# Patient Record
Sex: Male | Born: 1940 | Race: White | Hispanic: No | Marital: Married | Smoking: Former smoker
Health system: Southern US, Community
[De-identification: ages and names within clinical notes are randomized; demographics above are authoritative.]

## PROBLEM LIST (undated history)

## (undated) DIAGNOSIS — M069 Rheumatoid arthritis, unspecified: Secondary | ICD-10-CM

## (undated) DIAGNOSIS — L4050 Arthropathic psoriasis, unspecified: Secondary | ICD-10-CM

## (undated) DIAGNOSIS — C801 Malignant (primary) neoplasm, unspecified: Secondary | ICD-10-CM

## (undated) DIAGNOSIS — K5790 Diverticulosis of intestine, part unspecified, without perforation or abscess without bleeding: Secondary | ICD-10-CM

## (undated) DIAGNOSIS — L409 Psoriasis, unspecified: Secondary | ICD-10-CM

## (undated) HISTORY — DX: Arthropathic psoriasis, unspecified: L40.50

## (undated) HISTORY — PX: PROSTATECTOMY: SHX69

## (undated) HISTORY — DX: Rheumatoid arthritis, unspecified: M06.9

## (undated) HISTORY — PX: BACK SURGERY: SHX140

## (undated) HISTORY — PX: FINGER SURGERY: SHX640

---

## 2022-01-08 ENCOUNTER — Other Ambulatory Visit: Payer: Self-pay

## 2022-01-08 ENCOUNTER — Encounter: Payer: Self-pay | Admitting: Internal Medicine

## 2022-01-08 ENCOUNTER — Ambulatory Visit (INDEPENDENT_AMBULATORY_CARE_PROVIDER_SITE_OTHER): Payer: No Typology Code available for payment source | Admitting: Internal Medicine

## 2022-01-08 VITALS — BP 106/70 | HR 108 | Temp 97.8°F | Ht 73.0 in | Wt 136.0 lb

## 2022-01-08 DIAGNOSIS — J449 Chronic obstructive pulmonary disease, unspecified: Secondary | ICD-10-CM | POA: Diagnosis not present

## 2022-01-08 DIAGNOSIS — R0602 Shortness of breath: Secondary | ICD-10-CM | POA: Diagnosis not present

## 2022-01-08 DIAGNOSIS — J9611 Chronic respiratory failure with hypoxia: Secondary | ICD-10-CM | POA: Diagnosis not present

## 2022-01-08 DIAGNOSIS — J441 Chronic obstructive pulmonary disease with (acute) exacerbation: Secondary | ICD-10-CM

## 2022-01-08 NOTE — Addendum Note (Signed)
Addended by: Lorretta Harp on: 01/08/2022 05:25 PM ? ? Modules accepted: Orders ? ?

## 2022-01-08 NOTE — Patient Instructions (Addendum)
Please schedule follow up scheduled with myself in 3 months.  If my schedule is not open yet, we will contact you with a reminder closer to that time. Please call [phone] if you haven't heard from [place] a month before.  ? ?Before your next visit I would like you to have: ? ?Full set of PFTs- 1 hour next available ? ?Once you have the breathing testing done I can refer you to pulmonary rehab at [facility].  ? ?Take spiriva 2 puffs once a day.  ?Take wixela 1 puff twice a day.  ? ?Continue albuterol as needed.  ?

## 2022-01-08 NOTE — Progress Notes (Signed)
? ?      ?JAMUS LOVING    [id number]    03/09/1941 ? ?Primary Care Physician:Salmon, Minna Merritts, MD ? ?Referring Physician: Delorise Shiner, MD ?Randy Bowen ?[place],  [place] [id number] ?Reason for Consultation: shortness of breath ?Date of Consultation: 01/08/2022 ? ?Chief complaint:   ?Chief Complaint  ?Patient presents with  ? Consult  ?  Pt has had complaints of SOB which has been bad for the past 2 years but has been gradually getting worse. Pt has been diagnosed with COPD.  ?  ? ?HPI: ? ?Randy Bowen is a 81 y.o. who presents with worsening shortness of breath for the past 20 years. He is referred from the [place] for COPD. He is prednisone dependent for COPD on 10 mg daily but is also on it for rheumatoid arthritis and is currently on monoclonal ab for this. He hasn't been able to come off prednisone due to flares of psoriatic arthritis. Also has erythrodermic psoriasis.  ? ?He was told he had maybe 1 year to live by Dr. Gwenette Greet and that his lung function is at 36%.  ? ?Currently on spiriva and wixela and those seem to help a little bit. ?Has dyspnea with minimal exertion ? ?He is very depressed about his limited mobility and energy and is motivated to feel better. Would like to try pulmonary rehab. His prior pulmonologist has already had some long talks with him about COPD, progression and outlook ? ?Last year had RSV and shingles and was sick for a long time. Lost a lot of weight.  Had 3 rounds of antibiotics for pneumonia. ? ?Had aspiration/dysphagia evaluation and did not have any overt aspiration.  ? ?PFT's done March 2019- FEV1 34% predicted, hyperinflation present and severe impairment of DLCO ?Last ABG at [facility] was 09-13-17 on 3l [place], pH 7.47, PCO2 35, PO2 58 ?He has never had a sleep study and says he doesn't want a CPAP or BIPAP ? ?(PFTs 05/2015: FEV1.0 = 21.07 L, 32% of predicted) with hyperinflation (RV = 234% of predicted) and a severe decrease in diffusion (DLCO = 35% of predicted).  ? ?He wears  nocturnal oxygen and [facility]>  ? ?Social history: ? 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 ?Social History  ? ?Occupational History  ? Not on file  ?Tobacco Use  ? Smoking status: Former  ?  Packs/day: 1.00  ?  Years: 30.00  ?  Pack years: 30.00  ?  Types: Cigarettes  ?  Quit date: 2003  ?  Years since quitting: 20.2  ? Smokeless tobacco: Never  ?Substance and Sexual Activity  ? Alcohol use: Not on file  ? Drug use: Not on file  ? Sexual activity: Not on file  ? ? ?Relevant family history: ? ?Family History  ?Problem Relation Age of Onset  ? Emphysema Father   ? ? ?Past Medical History:  ?Diagnosis Date  ? Psoriatic arthritis ([place])   ? Rheumatoid arthritis ([place])   ? ? ?History reviewed. No pertinent surgical history. ? ? ?Physical Exam: ?Blood pressure 106/70, pulse (!) 108, temperature 97.8 ?F (36.6 ?C), temperature source Oral, height '6\' 1"'$  (1.854 m), weight 136 lb (61.7 kg), SpO2 96 %. ?Gen:      No acute distress, HOH ?ENT:  no nasal polyps, mucus membranes moist ?Lungs:    diminished, regular ?  CV:         tachycardic, regular ?Abd:      + bowel sounds; soft, non-tender; no distension ?MSK: no acute synovitis of DIP or PIP joints, no mechanics hands.  ?Skin:      Warm and dry; no rashes ?Neuro: normal speech, no focal facial asymmetry ?Psych: alert and oriented x3, normal mood and affect ? ? ?Data Reviewed/Medical Decision Making: ? ?Independent interpretation of tests: ?Imaging: ? ? ?PFTs: ?I have personally reviewed the patient's PFTs and reviewed above.  ?No flowsheet data found. ? ?Labs: CBC nov 2022 at [facility] shows Hgb 13.7 CMP same day shows glucose elevated but otherwise wnl.  ? ?Immunization status:  ?Immunization History  ?Administered Date(s) Administered  ? Pneumococcal Conjugate-13 11/04/2014, 11/27/2014  ? Pneumococcal Polysaccharide-23  08/01/2008, 08/02/2011  ? Pneumococcal-Unspecified 11/26/2002, 08/01/2009, 11/01/2009  ? ? ? I reviewed prior external note(s) from [place] ? I reviewed the result(s) of the labs and imaging as noted above.  ? I have ordered PFT ? ? 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 ?Plan/Recommendations: ? ?Continue spiriva 1 one puff once a day. Continue to take wixela 1 puff twice a day.  ?We discussed disease management and progression at length today.  ? ?Will obtain PFTs and refer to pulmonary rehab here at [facility] once I receive those.  ? ? ?Return to Care: ?Return in about 3 months (around 04/10/2022). ? ?Lenice Llamas, MD ?Pulmonary and Critical Care Medicine ?[place] ?Office:[phone] ? ?CC: Delorise Shiner, MD ? ? ? ?

## 2022-02-08 ENCOUNTER — Ambulatory Visit (INDEPENDENT_AMBULATORY_CARE_PROVIDER_SITE_OTHER): Payer: No Typology Code available for payment source | Admitting: Internal Medicine

## 2022-02-08 DIAGNOSIS — R0602 Shortness of breath: Secondary | ICD-10-CM

## 2022-02-08 DIAGNOSIS — J449 Chronic obstructive pulmonary disease, unspecified: Secondary | ICD-10-CM

## 2022-02-08 LAB — PULMONARY FUNCTION TEST
DL/VA % pred: 59 %
DL/VA: 2.31 ml/min/mmHg/L
DLCO cor % pred: 40 %
DLCO cor: 10.94 ml/min/mmHg
DLCO unc % pred: 34 %
DLCO unc: 9.29 ml/min/mmHg
FEF 25-75 Post: 0.48 L/sec
FEF 25-75 Pre: 0.42 L/sec
FEF2575-%Change-Post: 15 %
FEF2575-%Pred-Post: 20 %
FEF2575-%Pred-Pre: 17 %
FEV1-%Change-Post: 7 %
FEV1-%Pred-Post: 34 %
FEV1-%Pred-Pre: 32 %
FEV1-Post: 1.16 L
FEV1-Pre: 1.07 L
FEV1FVC-%Change-Post: 2 %
FEV1FVC-%Pred-Pre: 53 %
FEV6-%Change-Post: 5 %
FEV6-%Pred-Post: 60 %
FEV6-%Pred-Pre: 57 %
FEV6-Post: 2.64 L
FEV6-Pre: 2.51 L
FEV6FVC-%Change-Post: 0 %
FEV6FVC-%Pred-Post: 96 %
FEV6FVC-%Pred-Pre: 96 %
FVC-%Change-Post: 5 %
FVC-%Pred-Post: 62 %
FVC-%Pred-Pre: 59 %
FVC-Post: 2.91 L
FVC-Pre: 2.77 L
Post FEV1/FVC ratio: 40 %
Post FEV6/FVC ratio: 91 %
Pre FEV1/FVC ratio: 39 %
Pre FEV6/FVC Ratio: 91 %
RV % pred: 211 %
RV: 5.87 L
TLC % pred: 122 %
TLC: 9.4 L

## 2022-03-06 ENCOUNTER — Other Ambulatory Visit: Payer: Self-pay

## 2022-03-06 ENCOUNTER — Emergency Department (HOSPITAL_COMMUNITY)
Admission: EM | Admit: 2022-03-06 | Discharge: 2022-03-06 | Disposition: A | Discharge status: Home / Self Care | Payer: No Typology Code available for payment source | Attending: Student | Admitting: Student

## 2022-03-06 ENCOUNTER — Emergency Department (HOSPITAL_COMMUNITY): Payer: No Typology Code available for payment source

## 2022-03-06 ENCOUNTER — Encounter (HOSPITAL_COMMUNITY): Payer: Self-pay | Admitting: Emergency Medicine

## 2022-03-06 DIAGNOSIS — D72829 Elevated white blood cell count, unspecified: Secondary | ICD-10-CM | POA: Insufficient documentation

## 2022-03-06 DIAGNOSIS — R5383 Other fatigue: Secondary | ICD-10-CM | POA: Diagnosis not present

## 2022-03-06 DIAGNOSIS — Z87891 Personal history of nicotine dependence: Secondary | ICD-10-CM | POA: Diagnosis not present

## 2022-03-06 DIAGNOSIS — M25551 Pain in right hip: Secondary | ICD-10-CM | POA: Insufficient documentation

## 2022-03-06 DIAGNOSIS — L4050 Arthropathic psoriasis, unspecified: Secondary | ICD-10-CM

## 2022-03-06 LAB — CBC WITH DIFFERENTIAL/PLATELET
Abs Immature Granulocytes: 0.05 10*3/uL (ref 0.00–0.07)
Basophils Absolute: 0.1 10*3/uL (ref 0.0–0.1)
Basophils Relative: 1 %
Eosinophils Absolute: 0.1 10*3/uL (ref 0.0–0.5)
Eosinophils Relative: 1 %
HCT: 43.7 % (ref 39.0–52.0)
Hemoglobin: 13.7 g/dL (ref 13.0–17.0)
Immature Granulocytes: 0 %
Lymphocytes Relative: 19 %
Lymphs Abs: 2.4 10*3/uL (ref 0.7–4.0)
MCH: 29.8 pg (ref 26.0–34.0)
MCHC: 31.4 g/dL (ref 30.0–36.0)
MCV: 95 fL (ref 80.0–100.0)
Monocytes Absolute: 0.3 10*3/uL (ref 0.1–1.0)
Monocytes Relative: 3 %
Neutro Abs: 9.3 10*3/uL — ABNORMAL HIGH (ref 1.7–7.7)
Neutrophils Relative %: 76 %
Platelets: 473 10*3/uL — ABNORMAL HIGH (ref 150–400)
RBC: 4.6 MIL/uL (ref 4.22–5.81)
RDW: 14.5 % (ref 11.5–15.5)
WBC: 12.2 10*3/uL — ABNORMAL HIGH (ref 4.0–10.5)
nRBC: 0 % (ref 0.0–0.2)

## 2022-03-06 LAB — COMPREHENSIVE METABOLIC PANEL
ALT: 19 U/L (ref 0–44)
AST: 23 U/L (ref 15–41)
Albumin: 2.8 g/dL — ABNORMAL LOW (ref 3.5–5.0)
Alkaline Phosphatase: 59 U/L (ref 38–126)
Anion gap: 10 (ref 5–15)
BUN: 8 mg/dL (ref 8–23)
CO2: 26 mmol/L (ref 22–32)
Calcium: 8.8 mg/dL — ABNORMAL LOW (ref 8.9–10.3)
Chloride: 101 mmol/L (ref 98–111)
Creatinine, Ser: 0.88 mg/dL (ref 0.61–1.24)
GFR, Estimated: >60 mL/min (ref >60)
Glucose, Bld: 88 mg/dL (ref 70–99)
Potassium: 3.6 mmol/L (ref 3.5–5.1)
Sodium: 137 mmol/L (ref 135–145)
Total Bilirubin: 1.2 mg/dL (ref 0.3–1.2)
Total Protein: 6.4 g/dL — ABNORMAL LOW (ref 6.5–8.1)

## 2022-03-06 LAB — LACTIC ACID, PLASMA: Lactic Acid, Venous: 1.9 mmol/L (ref 0.5–1.9)

## 2022-03-06 LAB — SEDIMENTATION RATE: Sed Rate: 37 mm/hr — ABNORMAL HIGH (ref 0–16)

## 2022-03-06 MED ORDER — ONDANSETRON HCL 4 MG/2ML IJ SOLN
4.0000 mg | Freq: Once | INTRAMUSCULAR | Status: AC
  Administered 2022-03-06: 4 mg via INTRAVENOUS
  Filled 2022-03-06: qty 2

## 2022-03-06 MED ORDER — HYDROMORPHONE HCL 1 MG/ML IJ SOLN
1.0000 mg | Freq: Once | INTRAMUSCULAR | Status: AC
  Administered 2022-03-06: 1 mg via INTRAVENOUS
  Filled 2022-03-06: qty 1

## 2022-03-06 MED ORDER — HYDROCODONE-ACETAMINOPHEN 5-325 MG PO TABS: 2.0000 | ORAL_TABLET | ORAL | 0 refills | Status: DC | PRN

## 2022-03-06 MED ORDER — MORPHINE SULFATE (PF) 4 MG/ML IV SOLN
4.0000 mg | Freq: Once | INTRAVENOUS | Status: AC
  Administered 2022-03-06: 4 mg via INTRAVENOUS
  Filled 2022-03-06: qty 1

## 2022-03-06 MED ORDER — PREDNISONE 10 MG PO TABS: 10.0000 mg | ORAL_TABLET | Freq: Every day | ORAL | 0 refills | Status: AC

## 2022-03-06 MED ORDER — NAPROXEN 375 MG PO TABS: 375.0000 mg | ORAL_TABLET | Freq: Two times a day (BID) | ORAL | 0 refills | Status: DC

## 2022-03-06 NOTE — H&P (Deleted)
Family Medicine Teaching Service ?Service Pager: [phone] ?Brief Note ? ?Patient name: Randy Bowen Medical record number: [id number] ?Date of birth: Mar 28, 1941 Age: 81 y.o. Gender: male ? ?Primary Care Provider: Lucas Mallow, MD ?Preferred Emergency Contact:  Primary Emergency Contact: Windell Norfolk ? ?Chief Complaint: Generalized weakness and pain ? ?Assessment and Plan: ?Randy Bowen is a 81 y.o. male with PMH rheumatoid arthritis with multiple involvement, psoriatic arthritis, COPD, metastatic prostate cancer , who presented to [place] (03/06/2022) for generalized weakness and full-body pain after running out of his home prednisone 10 mg daily x2 days.   ? ?[facility]. ? ?In the ED, normotensive, comfortable on 3 L oxygen, no fever, alert and oriented x4, no focal deficits.  Mild leukocytosis of 12.2, elevated ESR 37.  They received IV morphine and Dilaudid to good effect. ? ?After discussing with ED provider, patient, family, attending, patient is not meet requirement for hospitalization. ?Rather we will refill patient's home prednisone to bridge him until he sees his PCP at the [facility] for hospital follow-up. ?Given patient's deconditioning, TOC consult for home health PT/OT/RN for assistance at home. ?TOC Consult ?Home prednisone 10 mg daily ?Encouraged follow-up with PCP at the [facility] for refills and referrals to rheumatology ? ? ?History of Present Illness:   ?Randy Bowen is a 81 y.o. male with PMH rheumatoid arthritis with multiple involvement, psoriatic arthritis, COPD, metastatic prostate cancer , who presented to [facility] (03/06/2022) for generalized weakness and full-body pain after running out of his home prednisone 10 mg daily x2 days.   ? ?Wife at bedside. ? ?Last night couldn't not get up and walk. States he has been in a lot of pain which wife says has been gong on since he was tried a new biologic for RA and psoriatic arthritis. Wife states he has 7 different types of arthritis. Took biologic 2  months ago and immediately had a reaction to it. Has had less than 20% sensation in lower extremities bilaterally but feels sensation has worsened. Denies any issues urinating. States whole body is in pain, can not pick an area that hurts worse.  ? ?States he typically takes prednisone 72m but has been out of it for over a month. ? ?States he had shingles in June last year and after that has had 3 episodes of pneumonia. ?Diagnosed with erythrodermic psoriasis  ? ?Recently moved to the area and does not currently see a rheumatologist. [place] rheumatologist [place] in [place]  ? ?Past Medical History: ?Past Medical History:  ?Diagnosis Date  ? Psoriatic arthritis ([place]   ? Rheumatoid arthritis ([place]   ? ? ?Past Surgical History: ?History reviewed. No pertinent surgical history. ? ?Social History: ?Social History  ? ?Tobacco Use  ? Smoking status: Former  ?  Packs/day: 1.00  ?  Years: 30.00  ?  Pack years: 30.00  ?  Types: Cigarettes  ?  Quit date: 2003  ?  Years since quitting: 20.3  ? Smokeless tobacco: Never  ? ?Please also refer to relevant sections of EMR. ? ?Family History: ?Family History  ?Problem Relation Age of Onset  ? Emphysema Father   ? ? ?Allergies and Medications: ?Allergies  ?Allergen Reactions  ? Skyrizi [Risankizumab] Nausea And Vomiting  ? ?No current facility-administered medications on file prior to encounter.  ? ?Current Outpatient Medications on File Prior to Encounter  ?Medication Sig Dispense Refill  ? Calcium Carb-Cholecalciferol 500-10 MG-MCG TABS Take 2 tablets by mouth daily.    ? Cholecalciferol 25 MCG (1000 UT)  tablet Take 2 tablets by mouth daily.    ? fluticasone-salmeterol (WIXELA INHUB) 500-50 MCG/ACT AEPB Inhale 1 puff into the lungs in the morning and at bedtime.    ? Ixekizumab 80 MG/ML SOAJ INJECT 160MG (2 AUTOINJECTORS) SUBCUTANEOUSLY ONCE FOR PSORIASIS    ? lidocaine (LIDODERM) 5 % APPLY 1 PATCH TO SKIN ONCE DAILY (APPLY FOR 12 HOURS, THEN REMOVE FOR 12 HOURS)    ? prednisoLONE  Sodium Phosphate POWD See admin instructions.    ? predniSONE (DELTASONE) 10 MG tablet TAKE ONE TABLET BY MOUTH DAILY TAKE WITH FOOD OR MILK    ? Tiotropium Bromide Monohydrate (SPIRIVA RESPIMAT) 2.5 MCG/ACT AERS Inhale into the lungs.    ? ? ?Objective: ?BP 105/71 (BP Location: Right Arm)   Pulse 100   Temp 98.9 ?F (37.2 ?C) (Oral)   Resp 18   Ht _0  (1.854 m)   SpO2 94%   BMI 17.94 kg/m?  ? ?Labs and Imaging: ?CBC BMET  ?Recent Labs  ?Lab 03/06/22 ?1207  ?WBC 12.2*  ?HGB 13.7  ?HCT 43.7  ?PLT 473*  ? Recent Labs  ?Lab 03/06/22 ?1207  ?NA 137  ?K 3.6  ?CL 101  ?CO2 26  ?BUN 8  ?CREATININE 0.88  ?GLUCOSE 88  ?CALCIUM 8.8*  ?  ? ?EKG Interpretation ? ?Date/Time:    ?Ventricular Rate:    ?PR Interval:    ?QRS Duration:   ?QT Interval:    ?QTC Calculation:   ?R Axis:     ?Text Interpretation:   ?  ? ?DG Chest 2 View ? ?Result Date: 03/06/2022 ?CLINICAL DATA:  Fever and cough for several days. Dyspnea. Fatigue. EXAM: CHEST - 2 VIEW COMPARISON:  None Available. FINDINGS: The heart size and mediastinal contours are within normal limits. Mild pulmonary hyperinflation and interstitial prominence is suspicious for COPD. No evidence of focal infiltrate or pleural effusion. Several mild upper and midthoracic vertebral body compression deformities are. IMPRESSION: Probable COPD. No active cardiopulmonary disease. Electronically Signed   By: Marlaine Hind M.D.   On: 03/06/2022 11:14  ? ?DG Hip Unilat W or Wo Pelvis 2-3 Views Right ? ?Result Date: 03/06/2022 ?CLINICAL DATA:  Right hip pain after fall mild EXAM: DG HIP (WITH OR WITHOUT PELVIS) 2-3V RIGHT COMPARISON:  None Available. FINDINGS: Osseous demineralization. There is no evidence of hip fracture or dislocation. Mild joint space narrowing of both hips. Degenerative disc disease of the lower lumbar spine. Numerous surgical clips within the pelvis. IMPRESSION: Negative. Electronically Signed   By: Davina Poke D.O.   On: 03/06/2022 12:55    ? ?Merrily Brittle,  DO ?03/06/2022, 3:04 PM ?PGY-1, [facility] ?[place] [facility] pager: [phone], text pages welcome  ?

## 2022-03-06 NOTE — ED Notes (Signed)
Got patient into a gown on the monitor patient is resting with family at bedside 

## 2022-03-06 NOTE — ED Triage Notes (Signed)
Pt arrives from home with complaints of generalized weakness, cough and fever for the past two days. Pt with hx of recurrent PNA. Patient recently admitted to [facility] in [place] in April. Per EMS patient has diminished lung sounds all over. Pt at baseline on 2 L O2 per [place] due to COPD but patient now on 3 L increased by EMS.  Per EMS pt not hypoxic but for comfort. VS: ? ?BP-96/68 ?HR 106  ?98% SpO2 on 3 L O2 [place].  ?

## 2022-03-06 NOTE — TOC Transition Note (Addendum)
Transition of Care (TOC) - CM/SW Discharge Note ? ? ?Patient Details  ?Name: Randy Bowen ?MRN: [id number] ?Date of Birth: 1941-06-17 ? ?Transition of Care (TOC) CM/SW Contact:  ?Verdell Carmine, RN ?Phone Number: ?03/06/2022, 4:56 PM ? ? ?Clinical Narrative:    ? ?Spoke with patient and wife at bedside regarding home health and needs. Patient states he would like PT OT aide, and social work. Struggle pulling things together with no point person, so added social work.  ? ?He goes to [place] for primary care.  ?Messaged [facility] regarding acceptance of [place]. Fax for authorization to [facility] attn Steva Ready [phone] orders, summary H&P and consults.  ?Patient has decided to leave by private vehicle for discharge. He can do without "oxygen while sitting up"   He is currently on 2L and saturating 93% while laying down. He states this is his normal flow. .He states his leg does feel better., and can move his right leg and bend it without too much pain.  ?Alvis Lemmings accepted for [facility] pending [facility] authorization. Patient is aware they have to process orders first.  ? ? ?Final next level of care: [place] ?Barriers to Discharge: No Barriers Identified ? ? ?Patient Goals and CMS Choice ?Patient states their goals for this hospitalization and ongoing recovery are:: Go back home pain control ?  ?  ? ?Discharge Placement ?  ?           ? Home with home health  ?  ?  ?  ? ?Discharge Plan and Services ?  ?  ?           ?  ?DME Agency:  (Has walker, scooter electronic wheelchair) ?  ?  ?  ?HH Arranged: PT, OT, Nurse's Aide, Social Work ?[place] Agency: [place] ?Date HH Agency Contacted: 03/06/22 ?Time [place]: [phone] ?Representative spoke with at [place]: Tommi Rumps ? ?Social Determinants of Health (SDOH) Interventions ?  ? ? ?Readmission Risk Interventions ?   ? View : No data to display.  ?  ?  ?  ? ? ? ? ? ?

## 2022-03-06 NOTE — ED Provider Notes (Signed)
?[facility] ?Provider Note ? ?CSN: [id number] ?Arrival date & time: 03/06/22 1006 ? ?Chief Complaint(s) ?Fatigue, Cough (fe), and Fever ? ?HPI ?Randy Bowen is a 81 y.o. male with PMH rheumatoid arthritis, psoriatic arthritis who presents the emergency department for evaluation of fatigue and joint pain.  Patient arrives with his wife who states that the patient has had frequent falls due to his lower extremity pain in all joints and the entire body getting worse.  Wife concerned that she may not be able to care for him at home.  Patient has multiple underlying autoimmune disorders and was recently admitted at [facility] and had improvement with steroids.  He currently denies chest pain, shortness of breath, cough, headache, fever or other systemic symptoms. ? ? ?Past Medical History ?Past Medical History:  ?Diagnosis Date  ? Psoriatic arthritis ([place])   ? Rheumatoid arthritis ([place])   ? ?There are no problems to display for this patient. ? ?Home Medication(s) ?Prior to Admission medications   ?Medication Sig Start Date End Date Taking? Authorizing Provider  ?predniSONE (DELTASONE) 10 MG tablet Take 1 tablet (10 mg total) by mouth daily for 15 days. 03/06/22 03/21/22 Yes Karsin Pesta, MD  ?Calcium Carb-Cholecalciferol 500-10 MG-MCG TABS Take 2 tablets by mouth daily. 09/19/12   [provider]  ?Cholecalciferol 25 MCG (1000 UT) tablet Take 2 tablets by mouth daily. 04/14/20   [provider]  ?fluticasone-salmeterol (WIXELA INHUB) 500-50 MCG/ACT AEPB Inhale 1 puff into the lungs in the morning and at bedtime.    [provider]  ?Ixekizumab 80 MG/ML SOAJ INJECT '160MG'$  (2 AUTOINJECTORS) SUBCUTANEOUSLY ONCE FOR PSORIASIS 12/24/21   [provider]  ?lidocaine (LIDODERM) 5 % APPLY 1 PATCH TO SKIN ONCE DAILY (APPLY FOR 12 HOURS, THEN REMOVE FOR 12 HOURS) 12/03/21   [provider]  ?prednisoLONE Sodium Phosphate POWD See admin  instructions. 12/23/17   [provider]  ?predniSONE (DELTASONE) 10 MG tablet TAKE ONE TABLET BY MOUTH DAILY TAKE WITH FOOD OR MILK 07/25/19   [provider]  ?Tiotropium Bromide Monohydrate (SPIRIVA RESPIMAT) 2.5 MCG/ACT AERS Inhale into the lungs.    [provider]  ?                                                                                                                                  ?Past Surgical History ?History reviewed. No pertinent surgical history. ?Family History ?Family History  ?Problem Relation Age of Onset  ? Emphysema Father   ? ? ?Social History ?Social History  ? ?Tobacco Use  ? Smoking status: Former  ?  Packs/day: 1.00  ?  Years: 30.00  ?  Pack years: 30.00  ?  Types: Cigarettes  ?  Quit date: 2003  ?  Years since quitting: 20.3  ? Smokeless tobacco: Never  ? ?Allergies ?Skyrizi [risankizumab] ? ?Review of Systems ?Review of Systems  ?Musculoskeletal:  Positive  for arthralgias and myalgias.  ? ?Physical Exam ?Vital Signs  ?I have reviewed the triage vital signs ?BP 96/77   Pulse 94   Temp 98.9 ?F (37.2 ?C) (Oral)   Resp 16   Ht '6\' 1"'$  (1.854 m)   SpO2 92%   BMI 17.94 kg/m?  ? ?Physical Exam ?Constitutional:   ?   General: He is not in acute distress. ?   Appearance: Normal appearance.  ?HENT:  ?   Head: Normocephalic and atraumatic.  ?   Nose: No congestion or rhinorrhea.  ?Eyes:  ?   General:     ?   Right eye: No discharge.     ?   Left eye: No discharge.  ?   Extraocular Movements: Extraocular movements intact.  ?   Pupils: Pupils are equal, round, and reactive to light.  ?Cardiovascular:  ?   Rate and Rhythm: Normal rate and regular rhythm.  ?   Heart sounds: No murmur heard. ?Pulmonary:  ?   Effort: No respiratory distress.  ?   Breath sounds: No wheezing or rales.  ?Abdominal:  ?   General: There is no distension.  ?   Tenderness: There is no abdominal tenderness.  ?Musculoskeletal:     ?   General: Tenderness (All joints, greatest at right hip)  present. Normal range of motion.  ?   Cervical back: Normal range of motion.  ?Skin: ?   General: Skin is warm and dry.  ?Neurological:  ?   General: No focal deficit present.  ?   Mental Status: He is alert.  ? ? ?ED Results and Treatments ?Labs ?(all labs ordered are listed, but only abnormal results are displayed) ?Labs Reviewed  ?COMPREHENSIVE METABOLIC PANEL - Abnormal; Notable for the following components:  ?    Result Value  ? Calcium 8.8 (*)   ? Total Protein 6.4 (*)   ? Albumin 2.8 (*)   ? All other components within normal limits  ?CBC WITH DIFFERENTIAL/PLATELET - Abnormal; Notable for the following components:  ? WBC 12.2 (*)   ? Platelets 473 (*)   ? Neutro Abs 9.3 (*)   ? All other components within normal limits  ?SEDIMENTATION RATE - Abnormal; Notable for the following components:  ? Sed Rate 37 (*)   ? All other components within normal limits  ?CULTURE, BLOOD (ROUTINE X 2)  ?CULTURE, BLOOD (ROUTINE X 2)  ?LACTIC ACID, PLASMA  ?BLOOD GAS, VENOUS  ?LACTIC ACID, PLASMA  ?C-REACTIVE PROTEIN  ?                                                                                                                       ? ?Radiology ?DG Chest 2 View ? ?Result Date: 03/06/2022 ?CLINICAL DATA:  Fever and cough for several days. Dyspnea. Fatigue. EXAM: CHEST - 2 VIEW COMPARISON:  None Available. FINDINGS: The heart size and mediastinal contours are within normal limits. Mild pulmonary hyperinflation and interstitial prominence is suspicious for COPD. No  evidence of focal infiltrate or pleural effusion. Several mild upper and midthoracic vertebral body compression deformities are. IMPRESSION: Probable COPD. No active cardiopulmonary disease. Electronically Signed   By: Marlaine Hind M.D.   On: 03/06/2022 11:14  ? ?DG Hip Unilat W or Wo Pelvis 2-3 Views Right ? ?Result Date: 03/06/2022 ?CLINICAL DATA:  Right hip pain after fall mild EXAM: DG HIP (WITH OR WITHOUT PELVIS) 2-3V RIGHT COMPARISON:  None Available. FINDINGS:  Osseous demineralization. There is no evidence of hip fracture or dislocation. Mild joint space narrowing of both hips. Degenerative disc disease of the lower lumbar spine. Numerous surgical clips within the pelvis. IMPRESSION: Negative. Electronically Signed   By: Davina Poke D.O.   On: 03/06/2022 12:55   ? ?Pertinent labs & imaging results that were available during my care of the patient were reviewed by me and considered in my medical decision making (see MDM for details). ? ?Medications Ordered in ED ?Medications  ?morphine (PF) 4 MG/ML injection 4 mg (4 mg Intravenous Given 03/06/22 1201)  ?ondansetron [facility]) injection 4 mg (4 mg Intravenous Given 03/06/22 1202)  ?HYDROmorphone (DILAUDID) injection 1 mg (1 mg Intravenous Given 03/06/22 1349)  ?                                                               ?                                                                    ?Procedures ?Procedures ? ?(including critical care time) ? ?Medical Decision Making / ED Course ? ? ?This patient presents to the ED for concern of joint pain, this involves an extensive number of treatment options, and is a complaint that carries with it a high risk of complications and morbidity.  The differential diagnosis includes psoriatic arthritis flare, rheumatoid arthritis flare, septic joint, sepsis, electrolyte abnormality ? ?MDM: ?Patient seen emergency department for evaluation of joint pain.  Physical exam reveals generalized tenderness to all joints but greatest at the right hip.  No associated joint swelling or erythema.  Laboratory evaluation with a mild leukocytosis to 12.2, sed rate elevated at 37 but lab evaluation otherwise unremarkable.  Chest x-ray and right hip right x-ray unremarkable.  Patient given pain control in the emergency department and symptoms improved.  I initially had plans to admit the patient for pain control and steroid administration for his rheumatologic flare, but the patient was evaluated by the  family medicine service and after a long discussion, it was agreed that the patient will be discharged with home health and steroids.  This is not an unreasonable disposition for this patient and the family agrees w

## 2022-03-06 NOTE — Progress Notes (Signed)
Family Medicine Teaching Service ?Service Pager: [phone] ?Brief Note ? ?Patient name: Randy Bowen Medical record number: [id number] ?Date of birth: Mar 28, 1941 Age: 81 y.o. Gender: male ? ?Primary Care Provider: Lucas Mallow, MD ?Preferred Emergency Contact:  Primary Emergency Contact: Windell Norfolk ? ?Chief Complaint: Generalized weakness and pain ? ?Assessment and Plan: ?Randy Bowen is a 81 y.o. male with PMH rheumatoid arthritis with multiple involvement, psoriatic arthritis, COPD, metastatic prostate cancer , who presented to [facility] (03/06/2022) for generalized weakness and full-body pain after running out of his home prednisone 10 mg daily x2 days.   ? ?[facility] connected. ? ?In the ED, normotensive, comfortable on 3 L oxygen, no fever, alert and oriented x4, no focal deficits.  Mild leukocytosis of 12.2, elevated ESR 37.  They received IV morphine and Dilaudid to good effect. ? ?After discussing with ED provider, patient, family, attending, patient is not meet requirement for hospitalization. ?Rather we will refill patient's home prednisone to bridge him until he sees his PCP at the [facility] for hospital follow-up. ?Given patient's deconditioning, TOC consult for home health PT/OT/RN for assistance at home. ?TOC Consult ?Home prednisone 10 mg daily ?Encouraged follow-up with PCP at the [facility] for refills and referrals to rheumatology ? ? ?History of Present Illness:   ?Randy Bowen is a 81 y.o. male with PMH rheumatoid arthritis with multiple involvement, psoriatic arthritis, COPD, metastatic prostate cancer , who presented to [facility] (03/06/2022) for generalized weakness and full-body pain after running out of his home prednisone 10 mg daily x2 days.   ? ?Wife at bedside. ? ?Last night couldn't not get up and walk. States he has been in a lot of pain which wife says has been gong on since he was tried a new biologic for RA and psoriatic arthritis. Wife states he has 7 different types of arthritis. Took biologic 2  months ago and immediately had a reaction to it. Has had less than 20% sensation in lower extremities bilaterally but feels sensation has worsened. Denies any issues urinating. States whole body is in pain, can not pick an area that hurts worse.  ? ?States he typically takes prednisone 72m but has been out of it for over a month. ? ?States he had shingles in June last year and after that has had 3 episodes of pneumonia. ?Diagnosed with erythrodermic psoriasis  ? ?Recently moved to the area and does not currently see a rheumatologist. [place]  ? ?Past Medical History: ?Past Medical History:  ?Diagnosis Date  ? [place]   ? [place]   ? ? ?Past Surgical History: ?History reviewed. No pertinent surgical history. ? ?Social History: ?Social History  ? ?Tobacco Use  ? Smoking status: Former  ?  Packs/day: 1.00  ?  Years: 30.00  ?  Pack years: 30.00  ?  Types: Cigarettes  ?  Quit date: 2003  ?  Years since quitting: 20.3  ? Smokeless tobacco: Never  ? ?Please also refer to relevant sections of EMR. ? ?Family History: ?Family History  ?Problem Relation Age of Onset  ? Emphysema Father   ? ? ?Allergies and Medications: ?Allergies  ?Allergen Reactions  ? Skyrizi [Risankizumab] Nausea And Vomiting  ? ?No current facility-administered medications on file prior to encounter.  ? ?Current Outpatient Medications on File Prior to Encounter  ?Medication Sig Dispense Refill  ? Calcium Carb-Cholecalciferol 500-10 MG-MCG TABS Take 2 tablets by mouth daily.    ? Cholecalciferol 25 MCG (1000 UT)  tablet Take 2 tablets by mouth daily.    ? fluticasone-salmeterol (WIXELA INHUB) 500-50 MCG/ACT AEPB Inhale 1 puff into the lungs in the morning and at bedtime.    ? Ixekizumab 80 MG/ML SOAJ INJECT 160MG (2 AUTOINJECTORS) SUBCUTANEOUSLY ONCE FOR PSORIASIS    ? lidocaine (LIDODERM) 5 % APPLY 1 PATCH TO SKIN ONCE DAILY (APPLY FOR 12 HOURS, THEN REMOVE FOR 12 HOURS)    ? prednisoLONE  Sodium Phosphate POWD See admin instructions.    ? predniSONE (DELTASONE) 10 MG tablet TAKE ONE TABLET BY MOUTH DAILY TAKE WITH FOOD OR MILK    ? Tiotropium Bromide Monohydrate (SPIRIVA RESPIMAT) 2.5 MCG/ACT AERS Inhale into the lungs.    ? ? ?Objective: ?BP 96/77   Pulse 94   Temp 98.9 ?F (37.2 ?C) (Oral)   Resp 16   Ht _0  (1.854 m)   SpO2 92%   BMI 17.94 kg/m?  ? ?Labs and Imaging: ?CBC BMET  ?Recent Labs  ?Lab 03/06/22 ?1207  ?WBC 12.2*  ?HGB 13.7  ?HCT 43.7  ?PLT 473*  ? ? Recent Labs  ?Lab 03/06/22 ?1207  ?NA 137  ?K 3.6  ?CL 101  ?CO2 26  ?BUN 8  ?CREATININE 0.88  ?GLUCOSE 88  ?CALCIUM 8.8*  ? ?  ? ? ?DG Chest 2 View ? ?Result Date: 03/06/2022 ?CLINICAL DATA:  Fever and cough for several days. Dyspnea. Fatigue. EXAM: CHEST - 2 VIEW COMPARISON:  None Available. FINDINGS: The heart size and mediastinal contours are within normal limits. Mild pulmonary hyperinflation and interstitial prominence is suspicious for COPD. No evidence of focal infiltrate or pleural effusion. Several mild upper and midthoracic vertebral body compression deformities are. IMPRESSION: Probable COPD. No active cardiopulmonary disease. Electronically Signed   By: Marlaine Hind M.D.   On: 03/06/2022 11:14  ? ?DG Hip Unilat W or Wo Pelvis 2-3 Views Right ? ?Result Date: 03/06/2022 ?CLINICAL DATA:  Right hip pain after fall mild EXAM: DG HIP (WITH OR WITHOUT PELVIS) 2-3V RIGHT COMPARISON:  None Available. FINDINGS: Osseous demineralization. There is no evidence of hip fracture or dislocation. Mild joint space narrowing of both hips. Degenerative disc disease of the lower lumbar spine. Numerous surgical clips within the pelvis. IMPRESSION: Negative. Electronically Signed   By: Davina Poke D.O.   On: 03/06/2022 12:55    ? ?Merrily Brittle, DO ?03/06/2022, 4:30 PM ?PGY-1, [place] ?[place] Intern pager: [phone], text pages welcome  ?

## 2022-03-11 LAB — CULTURE, BLOOD (ROUTINE X 2)
Culture: NO GROWTH
Special Requests: ADEQUATE

## 2022-03-21 ENCOUNTER — Emergency Department (HOSPITAL_COMMUNITY): Payer: No Typology Code available for payment source

## 2022-03-21 ENCOUNTER — Encounter (HOSPITAL_COMMUNITY): Payer: Self-pay | Admitting: *Deleted

## 2022-03-21 ENCOUNTER — Observation Stay (HOSPITAL_COMMUNITY)
Admission: EM | Admit: 2022-03-21 | Discharge: 2022-03-22 | Disposition: A | Discharge status: Home Health | Payer: No Typology Code available for payment source | Attending: Internal Medicine | Admitting: Internal Medicine

## 2022-03-21 DIAGNOSIS — J9611 Chronic respiratory failure with hypoxia: Secondary | ICD-10-CM | POA: Insufficient documentation

## 2022-03-21 DIAGNOSIS — Z7952 Long term (current) use of systemic steroids: Secondary | ICD-10-CM | POA: Diagnosis not present

## 2022-03-21 DIAGNOSIS — Z8546 Personal history of malignant neoplasm of prostate: Secondary | ICD-10-CM | POA: Diagnosis not present

## 2022-03-21 DIAGNOSIS — E441 Mild protein-calorie malnutrition: Secondary | ICD-10-CM | POA: Insufficient documentation

## 2022-03-21 DIAGNOSIS — R000 Tachycardia, unspecified: Secondary | ICD-10-CM | POA: Insufficient documentation

## 2022-03-21 DIAGNOSIS — L408 Other psoriasis: Secondary | ICD-10-CM | POA: Diagnosis not present

## 2022-03-21 DIAGNOSIS — R531 Weakness: Secondary | ICD-10-CM | POA: Diagnosis not present

## 2022-03-21 DIAGNOSIS — J441 Chronic obstructive pulmonary disease with (acute) exacerbation: Principal | ICD-10-CM | POA: Insufficient documentation

## 2022-03-21 DIAGNOSIS — Z20822 Contact with and (suspected) exposure to covid-19: Secondary | ICD-10-CM | POA: Insufficient documentation

## 2022-03-21 DIAGNOSIS — R0602 Shortness of breath: Secondary | ICD-10-CM

## 2022-03-21 DIAGNOSIS — Z85828 Personal history of other malignant neoplasm of skin: Secondary | ICD-10-CM | POA: Insufficient documentation

## 2022-03-21 DIAGNOSIS — Z87891 Personal history of nicotine dependence: Secondary | ICD-10-CM | POA: Diagnosis not present

## 2022-03-21 DIAGNOSIS — D75839 Thrombocytosis, unspecified: Secondary | ICD-10-CM | POA: Diagnosis not present

## 2022-03-21 DIAGNOSIS — I714 Abdominal aortic aneurysm, without rupture, unspecified: Secondary | ICD-10-CM | POA: Diagnosis not present

## 2022-03-21 DIAGNOSIS — J449 Chronic obstructive pulmonary disease, unspecified: Secondary | ICD-10-CM

## 2022-03-21 LAB — CBC WITH DIFFERENTIAL/PLATELET
Abs Immature Granulocytes: 0.09 10*3/uL — ABNORMAL HIGH (ref 0.00–0.07)
Basophils Absolute: 0 10*3/uL (ref 0.0–0.1)
Basophils Relative: 0 %
Eosinophils Absolute: 0.2 10*3/uL (ref 0.0–0.5)
Eosinophils Relative: 1 %
HCT: 40.2 % (ref 39.0–52.0)
Hemoglobin: 12.9 g/dL — ABNORMAL LOW (ref 13.0–17.0)
Immature Granulocytes: 1 %
Lymphocytes Relative: 19 %
Lymphs Abs: 2.6 10*3/uL (ref 0.7–4.0)
MCH: 29.5 pg (ref 26.0–34.0)
MCHC: 32.1 g/dL (ref 30.0–36.0)
MCV: 92 fL (ref 80.0–100.0)
Monocytes Absolute: 0.7 10*3/uL (ref 0.1–1.0)
Monocytes Relative: 5 %
Neutro Abs: 10.3 10*3/uL — ABNORMAL HIGH (ref 1.7–7.7)
Neutrophils Relative %: 74 %
Platelets: 488 10*3/uL — ABNORMAL HIGH (ref 150–400)
RBC: 4.37 MIL/uL (ref 4.22–5.81)
RDW: 14.4 % (ref 11.5–15.5)
WBC: 13.9 10*3/uL — ABNORMAL HIGH (ref 4.0–10.5)
nRBC: 0 % (ref 0.0–0.2)

## 2022-03-21 LAB — URINALYSIS, ROUTINE W REFLEX MICROSCOPIC
Bilirubin Urine: NEGATIVE
Glucose, UA: NEGATIVE mg/dL
Hgb urine dipstick: NEGATIVE
Ketones, ur: NEGATIVE mg/dL
Leukocytes,Ua: NEGATIVE
Nitrite: NEGATIVE
Protein, ur: NEGATIVE mg/dL
Specific Gravity, Urine: 1.014 (ref 1.005–1.030)
pH: 5 (ref 5.0–8.0)

## 2022-03-21 LAB — TSH: TSH: 4.366 u[IU]/mL (ref 0.350–4.500)

## 2022-03-21 LAB — COMPREHENSIVE METABOLIC PANEL
ALT: 18 U/L (ref 0–44)
AST: 21 U/L (ref 15–41)
Albumin: 2.7 g/dL — ABNORMAL LOW (ref 3.5–5.0)
Alkaline Phosphatase: 66 U/L (ref 38–126)
Anion gap: 8 (ref 5–15)
BUN: 8 mg/dL (ref 8–23)
CO2: 25 mmol/L (ref 22–32)
Calcium: 8.7 mg/dL — ABNORMAL LOW (ref 8.9–10.3)
Chloride: 104 mmol/L (ref 98–111)
Creatinine, Ser: 0.81 mg/dL (ref 0.61–1.24)
GFR, Estimated: >60 mL/min (ref >60)
Glucose, Bld: 88 mg/dL (ref 70–99)
Potassium: 4 mmol/L (ref 3.5–5.1)
Sodium: 137 mmol/L (ref 135–145)
Total Bilirubin: 0.6 mg/dL (ref 0.3–1.2)
Total Protein: 6 g/dL — ABNORMAL LOW (ref 6.5–8.1)

## 2022-03-21 LAB — BRAIN NATRIURETIC PEPTIDE: B Natriuretic Peptide: 25.4 pg/mL (ref 0.0–100.0)

## 2022-03-21 LAB — TROPONIN I (HIGH SENSITIVITY)
Troponin I (High Sensitivity): 47 ng/L — ABNORMAL HIGH (ref <18)
Troponin I (High Sensitivity): 21 ng/L — ABNORMAL HIGH (ref <18)

## 2022-03-21 LAB — RESP PANEL BY RT-PCR (FLU A&B, COVID) ARPGX2
Influenza A by PCR: NEGATIVE
Influenza B by PCR: NEGATIVE
SARS Coronavirus 2 by RT PCR: NEGATIVE

## 2022-03-21 LAB — D-DIMER, QUANTITATIVE: D-Dimer, Quant: 12.74 ug/mL-FEU — ABNORMAL HIGH (ref 0.00–0.50)

## 2022-03-21 MED ORDER — HYDROCODONE-ACETAMINOPHEN 10-325 MG PO TABS
1.0000 | ORAL_TABLET | Freq: Four times a day (QID) | ORAL | Status: DC | PRN
  Administered 2022-03-21: 1 via ORAL
  Filled 2022-03-21: qty 1

## 2022-03-21 MED ORDER — UMECLIDINIUM BROMIDE 62.5 MCG/ACT IN AEPB
1.0000 | INHALATION_SPRAY | Freq: Every day | RESPIRATORY_TRACT | Status: DC
  Filled 2022-03-21: qty 7

## 2022-03-21 MED ORDER — ENSURE ENLIVE PO LIQD
237.0000 mL | Freq: Two times a day (BID) | ORAL | Status: DC
  Administered 2022-03-21 – 2022-03-22 (×2): 237 mL via ORAL
  Filled 2022-03-21: qty 237

## 2022-03-21 MED ORDER — METHYLPREDNISOLONE SODIUM SUCC 125 MG IJ SOLR
125.0000 mg | Freq: Once | INTRAMUSCULAR | Status: AC
  Administered 2022-03-21: 125 mg via INTRAVENOUS
  Filled 2022-03-21: qty 2

## 2022-03-21 MED ORDER — AZITHROMYCIN 250 MG PO TABS: 500.0000 mg | ORAL_TABLET | Freq: Once | ORAL | Status: DC

## 2022-03-21 MED ORDER — LACTATED RINGERS IV SOLN: INTRAVENOUS | Status: DC

## 2022-03-21 MED ORDER — IPRATROPIUM-ALBUTEROL 0.5-2.5 (3) MG/3ML IN SOLN: 3.0000 mL | RESPIRATORY_TRACT | Status: DC | PRN

## 2022-03-21 MED ORDER — MOMETASONE FURO-FORMOTEROL FUM 200-5 MCG/ACT IN AERO
2.0000 | INHALATION_SPRAY | Freq: Two times a day (BID) | RESPIRATORY_TRACT | Status: DC
  Filled 2022-03-21: qty 8.8

## 2022-03-21 MED ORDER — SODIUM CHLORIDE 0.9 % IV SOLN: 500.0000 mg | Freq: Once | INTRAVENOUS | Status: DC

## 2022-03-21 MED ORDER — HYDROCODONE-ACETAMINOPHEN 5-325 MG PO TABS: 1.0000 | ORAL_TABLET | Freq: Four times a day (QID) | ORAL | Status: DC | PRN

## 2022-03-21 MED ORDER — HYDROCODONE-ACETAMINOPHEN 5-325 MG PO TABS
2.0000 | ORAL_TABLET | Freq: Four times a day (QID) | ORAL | Status: DC | PRN
  Filled 2022-03-21: qty 2

## 2022-03-21 MED ORDER — ONDANSETRON HCL 4 MG/2ML IJ SOLN: 4.0000 mg | Freq: Four times a day (QID) | INTRAMUSCULAR | Status: DC | PRN

## 2022-03-21 MED ORDER — SODIUM CHLORIDE 0.9 % IV SOLN
Freq: Once | INTRAVENOUS | Status: AC
Start: 2022-03-21 — End: 2022-03-21

## 2022-03-21 MED ORDER — AZITHROMYCIN 250 MG PO TABS
500.0000 mg | ORAL_TABLET | Freq: Every day | ORAL | Status: DC
  Administered 2022-03-22: 500 mg via ORAL
  Filled 2022-03-21: qty 2

## 2022-03-21 MED ORDER — PREDNISONE 20 MG PO TABS
40.0000 mg | ORAL_TABLET | Freq: Every day | ORAL | Status: DC
Start: 2022-03-22 — End: 2022-03-22
  Administered 2022-03-22: 40 mg via ORAL
  Filled 2022-03-21: qty 2

## 2022-03-21 MED ORDER — ENOXAPARIN SODIUM 40 MG/0.4ML IJ SOSY
40.0000 mg | PREFILLED_SYRINGE | INTRAMUSCULAR | Status: DC
  Administered 2022-03-21: 40 mg via SUBCUTANEOUS
  Filled 2022-03-21: qty 0.4

## 2022-03-21 MED ORDER — ONDANSETRON HCL 4 MG PO TABS: 4.0000 mg | ORAL_TABLET | Freq: Four times a day (QID) | ORAL | Status: DC | PRN

## 2022-03-21 MED ORDER — ACETAMINOPHEN 650 MG RE SUPP: 650.0000 mg | Freq: Four times a day (QID) | RECTAL | Status: DC | PRN

## 2022-03-21 MED ORDER — ACETAMINOPHEN 325 MG PO TABS: 650.0000 mg | ORAL_TABLET | Freq: Four times a day (QID) | ORAL | Status: DC | PRN

## 2022-03-21 MED ORDER — ALBUTEROL SULFATE (2.5 MG/3ML) 0.083% IN NEBU
2.5000 mg | INHALATION_SOLUTION | Freq: Once | RESPIRATORY_TRACT | Status: AC
  Administered 2022-03-21: 2.5 mg via RESPIRATORY_TRACT
  Filled 2022-03-21: qty 3

## 2022-03-21 MED ORDER — SODIUM CHLORIDE 0.9% FLUSH
3.0000 mL | Freq: Two times a day (BID) | INTRAVENOUS | Status: DC
  Administered 2022-03-22: 3 mL via INTRAVENOUS

## 2022-03-21 NOTE — ED Provider Notes (Signed)
[facility] EMERGENCY DEPARTMENT Provider Note   CSN: [id number] Arrival date & time: 03/21/22  1720     History  Chief Complaint  Patient presents with   Shortness of Breath   Dizziness    Randy Bowen is a 81 y.o. male.  81 year old male with prior medical history as detailed below presents for evaluation.  Patient's primary care is through the [place].  Patient reports that he ran out of his chronic prednisone - 10 mg taken daily - approximately 2 days ago.  In the intervening time, he is developed significant pain and weakness.  He complains of significant increase shortness of breath today.  Additionally he reports that he fell in the tub approximately 1 week ago.  He did hit his head.  He complains of persistent pain to the left ribs and left hip.  He reports that ambulation is difficult secondary to this persistent pain.  He lives at home with his wife.  Additional history provided by his wife.  She reports that the patient has had persistent nausea and vomiting for the last several days.  He is not taking good p.o.  He has difficulty ambulating secondary to both his hip pain and also shortness of breath.  Prior medical history significant for COPD on chronic O2 via nasal cannula, psoriatic arthritis, rheumatoid arthritis, ankylosing spondylitis.  The history is provided by the patient and medical records.  Shortness of Breath Severity:  Mild Onset quality:  Gradual Duration:  2 days Timing:  Constant Progression:  Worsening Chronicity:  New Dizziness Associated symptoms: shortness of breath       Home Medications Prior to Admission medications   Medication Sig Start Date End Date Taking? Authorizing Provider  Calcium Carb-Cholecalciferol 500-10 MG-MCG TABS Take 2 tablets by mouth daily. 09/19/12   [provider]  Cholecalciferol 25 MCG (1000 UT) tablet Take 2 tablets by mouth daily. 04/14/20   [provider]   fluticasone-salmeterol (WIXELA INHUB) 500-50 MCG/ACT AEPB Inhale 1 puff into the lungs in the morning and at bedtime.    [provider]  HYDROcodone-acetaminophen (NORCO/VICODIN) 5-325 MG tablet Take 2 tablets by mouth every 4 (four) hours as needed. 03/06/22   Kommor, Madison, MD  Ixekizumab 80 MG/ML SOAJ INJECT '160MG'$  (2 AUTOINJECTORS) SUBCUTANEOUSLY ONCE FOR PSORIASIS 12/24/21   [provider]  lidocaine (LIDODERM) 5 % APPLY 1 PATCH TO SKIN ONCE DAILY (APPLY FOR 12 HOURS, THEN REMOVE FOR 12 HOURS) 12/03/21   [provider]  naproxen (NAPROSYN) 375 MG tablet Take 1 tablet (375 mg total) by mouth 2 (two) times daily. 03/06/22   Kommor, Debe Coder, MD  prednisoLONE Sodium Phosphate POWD See admin instructions. 12/23/17   [provider]  predniSONE (DELTASONE) 10 MG tablet TAKE ONE TABLET BY MOUTH DAILY TAKE WITH FOOD OR MILK 07/25/19   [provider]  predniSONE (DELTASONE) 10 MG tablet Take 1 tablet (10 mg total) by mouth daily for 15 days. 03/06/22 03/21/22  Kommor, Madison, MD  Tiotropium Bromide Monohydrate (SPIRIVA RESPIMAT) 2.5 MCG/ACT AERS Inhale into the lungs.    [provider]      Allergies    Skyrizi [risankizumab]    Review of Systems   Review of Systems  Respiratory:  Positive for shortness of breath.   Neurological:  Positive for dizziness.  All other systems reviewed and are negative.  Physical Exam Updated Vital Signs BP 110/67   Pulse 99   Temp 99.1 F (37.3 C) (Oral)  Resp (!) 22   Ht '6\' 1"'$  (1.854 m)   Wt 61.7 kg   SpO2 99%   BMI 17.95 kg/m  Physical Exam Vitals and nursing note reviewed.  Constitutional:      General: He is not in acute distress.    Appearance: Normal appearance.     Comments: Chronically ill in appearance  HENT:     Head: Normocephalic and atraumatic.  Eyes:     Conjunctiva/sclera: Conjunctivae normal.     Pupils: Pupils are equal, round, and reactive to light.  Cardiovascular:     Rate  and Rhythm: Normal rate and regular rhythm.     Heart sounds: Normal heart sounds.  Pulmonary:     Effort: Pulmonary effort is normal. No respiratory distress.     Breath sounds: Decreased breath sounds present.     Comments: Diffusely decreased breath sounds bilaterally - trace bilateral expiratory wheezes present with exam Abdominal:     General: There is no distension.     Palpations: Abdomen is soft.     Tenderness: There is no abdominal tenderness.  Musculoskeletal:        General: No deformity. Normal range of motion.     Cervical back: Normal range of motion and neck supple.  Skin:    General: Skin is warm and dry.  Neurological:     General: No focal deficit present.     Mental Status: He is alert and oriented to person, place, and time.    ED Results / Procedures / Treatments   Labs (all labs ordered are listed, but only abnormal results are displayed) Labs Reviewed  CBC WITH DIFFERENTIAL/PLATELET - Abnormal; Notable for the following components:      Result Value   WBC 13.9 (*)    Hemoglobin 12.9 (*)    Platelets 488 (*)    Neutro Abs 10.3 (*)    Abs Immature Granulocytes 0.09 (*)    All other components within normal limits  COMPREHENSIVE METABOLIC PANEL - Abnormal; Notable for the following components:   Calcium 8.7 (*)    Total Protein 6.0 (*)    Albumin 2.7 (*)    All other components within normal limits  TROPONIN I (HIGH SENSITIVITY) - Abnormal; Notable for the following components:   Troponin I (High Sensitivity) 47 (*)    All other components within normal limits  RESP PANEL BY RT-PCR (FLU A&B, COVID) ARPGX2  BRAIN NATRIURETIC PEPTIDE  URINALYSIS, ROUTINE W REFLEX MICROSCOPIC  TROPONIN I (HIGH SENSITIVITY)    EKG EKG Interpretation  Date/Time:  Sunday Mar 21 2022 17:36:25 EDT Ventricular Rate:  119 PR Interval:  131 QRS Duration: 82 QT Interval:  315 QTC Calculation: 444 R Axis:   88 Text Interpretation: Sinus tachycardia Anterior infarct, old  Minimal ST depression, inferior leads Confirmed by Dene Gentry [phone]) on 03/21/2022 5:39:53 PM  Radiology CT Head Wo Contrast  Result Date: 03/21/2022 CLINICAL DATA:  Altered mental status. EXAM: CT HEAD WITHOUT CONTRAST TECHNIQUE: Contiguous axial images were obtained from the base of the skull through the vertex without intravenous contrast. RADIATION DOSE REDUCTION: This exam was performed according to the departmental dose-optimization program which includes automated exposure control, adjustment of the mA and/or kV according to patient size and/or use of iterative reconstruction technique. COMPARISON:  None Available. FINDINGS: Brain: No evidence of intracranial hemorrhage, acute infarction, hydrocephalus, extra-axial collection, or mass lesion/mass effect. Mild diffuse cerebral atrophy and chronic small vessel disease noted. Vascular:  No hyperdense vessel or other acute  findings. Skull: No evidence of fracture or other significant bone abnormality. Sinuses/Orbits:  No acute findings. Other: None. IMPRESSION: No acute intracranial abnormality. 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 Electronically Signed   By: Marlaine Hind M.D.   On: 03/21/2022 18:13   DG Chest Port 1 View  Result Date: 03/21/2022 CLINICAL DATA:  Shortness of breath. EXAM: PORTABLE CHEST 1 VIEW COMPARISON:  Chest x-ray 03/06/2022 FINDINGS: The heart size and mediastinal contours are within normal limits. Small nodular density in the left lung apex is unchanged and likely calcified. The lungs are otherwise clear. IMPRESSION: No active disease. Electronically Signed   By: Ronney Asters M.D.   On: 03/21/2022 18:46   DG Hip Unilat W or Wo Pelvis 2-3 Views Left  Result Date: 03/21/2022 CLINICAL DATA:  Shortness of breath, hip pain. EXAM: DG HIP (WITH OR WITHOUT PELVIS) 2-3V LEFT COMPARISON:  Pelvis radiograph dated 03/06/2022. FINDINGS: There is no evidence of hip fracture or dislocation. Mild bilateral hip  osteoarthritis. Severe degenerative changes in the lumbar spine. Surgical clips throughout the pelvis. Stool is seen in the rectum. IMPRESSION: Degenerative changes without acute osseous injury. Electronically Signed   By: Zerita Boers M.D.   On: 03/21/2022 18:36    Procedures Procedures    Medications Ordered in ED Medications  methylPREDNISolone sodium succinate (SOLU-MEDROL) 125 mg/2 mL injection 125 mg (has no administration in time range)  albuterol (PROVENTIL) (2.5 MG/3ML) 0.083% nebulizer solution 2.5 mg (has no administration in time range)    ED Course/ Medical Decision Making/ A&P                           Medical Decision Making Amount and/or Complexity of Data Reviewed Labs: ordered. Radiology: ordered.  Risk Prescription drug management.    Medical Screen Complete  This patient presented to the ED with complaint of shortness of breath, weakness, fall.  This complaint involves an extensive number of treatment options. The initial differential diagnosis includes, but is not limited to, COPD exacerbation, deconditioning, adrenal insufficiency, metabolic abnormality, etc.  This presentation is: Acute, Chronic, Self-Limited, Previously Undiagnosed, Uncertain Prognosis, Complicated, Systemic Symptoms, and Threat to Life/Bodily Function  Patient presents with complaint of generalized weakness, shortness of breath, and reported fall that occurred about 7 days ago.  Patient without evidence of significant trauma from fall  Imaging obtained is without evidence of traumatic injury.  Patient with complaint of generalized weakness and shortness of breath.  Patient admits that he ran out of his chronic prednisone 2 days ago.  Patient with mild wheezing on exam.  Presentation is concerning for possible COPD exacerbation and or early adrenal insufficiency related to inadequate prednisone consumption given longstanding history of prednisone use.  Patient would likely benefit  from admission.  Teaching service is aware of case and evaluate for admission.   Additional history obtained:  Additional history obtained from Spouse External records from outside sources obtained and reviewed including prior ED visits and prior Inpatient records.    Lab Tests:  I ordered and personally interpreted labs.  The pertinent results include: CBC COVID, flu, troponin, CMP, BNP   Imaging Studies ordered:  I ordered imaging studies including chest x-ray I independently visualized and interpreted obtained imaging which showed NAD I agree with the radiologist interpretation.   Cardiac Monitoring:  The patient was maintained on a cardiac monitor.  I personally viewed and interpreted the cardiac monitor which showed an underlying rhythm of: NSR  Medicines ordered:  I ordered medication including Solu-Medrol for COPD exacerbation Reevaluation of the patient after these medicines showed that the patient: improved    Problem List / ED Course:  Weakness, shortness of breath, fall   Reevaluation:  After the interventions noted above, I reevaluated the patient and found that they have: improved Disposition:  After consideration of the diagnostic results and the patients response to treatment, I feel that the patent would benefit from admission.          Final Clinical Impression(s) / ED Diagnoses Final diagnoses:  Weakness  Shortness of breath    Rx / DC Orders ED Discharge Orders     None         Valarie Merino, MD 03/21/22 2107

## 2022-03-21 NOTE — ED Triage Notes (Signed)
Pt here via [facility] from home.  Per their report, he is a failure to thrive and palliative should be consulted.  Pt states he fell in the tub last Saturday and hurt his ribs.  Since then he is more sob.  Diminished L lung sounds.  Pt states blurry vision with increased activity.

## 2022-03-21 NOTE — H&P (Signed)
Date: 03/21/2022               Patient Name:  Randy Bowen MRN: [id number]  DOB: 1941/02/19 Age / Sex: 81 y.o., male   PCP: Lucas Mallow, MD         Medical Service: Internal Medicine Teaching Service         Attending Physician: Dr. Lottie Mussel, MD    First Contact: Lajean Manes, MD Pager: MP [phone]  Second Contact: Linwood Dibbles, MD Pager: PA [phone]       After Hours (After 5p/  First Contact Pager: [phone]  weekends / holidays): Second Contact Pager: [phone]   SUBJECTIVE  Chief Complaint: shortness of breath and weakness  History of Present Illness: Randy Bowen is a 81 y.o. 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 History obtained by patient and wife at bedside.  Patient reports general decline in overall health over the past year after having shingles and RSV with multiple hospitalizations resulting in physical deconditioning and approximately 40lb weight loss. However, is particularly concerned with progressively worsening shortness of breath over the past month with decreased oral intake and fatigue. He also endorses intermittent lightheadedness/dizziness with a fall in the bath tub last week with head trauma, left hip and left rib pain resulting in difficulty with ambulation as a result of which he has started using a walker. He endorses ongoing left rib pain. Patient endorses good oral  intake but does report feeling nauseous after eating for which he takes zofran as needed. He often supplements with Ensure protein shakes between meals to help with energy levels. Per wife, patient usually drinks 32oz of water throughout the night, but reports that he has not been doing so recently. He does feel like he is dehydrated from the color and odor of his urine but denies any dysuria or other urinary symptoms. He does endorse some lightheadedness/dizziness with intermittent blurry vision.  Approximately one month ago, patient's biologics were changed to [place] after which he reported swelling in arms and legs that has since resolved. He denies any chest pain, fevers/chills, worsening of his psoriasis, changes in bowel habits, abdominal pain, current nausea/vomiting or chest pain.   In the ED, the patient was tachycardic to 114 and with mildly elevated RR at 22. On lab evaluation, Troponin at 47->21 , CBC of WBC 13.9, Hgb 12.9, albumin 2.7, BNP wnl, Hip x-ray showed mild bilateral hip osteoarthritis, CT head showed no acute intracranial abnormality, and CXR showed no active disease although did show persistent apical nodularity in left lung. He was given 1L bolus, methylprednisolone 125 mg, and albuterol nebulizer. Patient admitted for further evaluation and management.   Medications: Acetaminophen 325 mg Ondansetron 4 mg  Naproxen 375 mg Fluticasone- salmeterol 500/50 Prednisone 10 mg Tofacitinib ER 11 mg  Past Medical History:  Past Medical History:  Diagnosis Date   Psoriatic arthritis ([place])    Rheumatoid arthritis ([place])     Social:  Lives - with his wife. His daughter lives next door and cooks for them most days.  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  PCP - [facility]   Family History: Family History  Problem Relation Age of Onset   Emphysema Father     Allergies: Allergies as of 03/21/2022 - Review Complete 03/21/2022  Allergen Reaction Noted   Orson Ape [risankizumab] Nausea And Vomiting 03/06/2022    Review of Systems: A complete ROS was negative except as per HPI.   OBJECTIVE:  Physical Exam: Blood pressure 114/79, pulse 97, temperature 99.1 F (37.3 C), temperature source Oral, resp. rate (!) 22, height '6\' 1"'$  (1.854 m), weight 61.7 kg, SpO2 99 %. Physical Exam  Constitutional: Thin, tall frail appearing elderly male, sitting on edge of bed, no acute distress.  HENT: Normocephalic and atraumatic, EOMI, conjunctiva normal, moist mucous membranes Cardiovascular: Tachycardic, regular rhythm, S1 and S2 present, no murmurs, rubs, gallops.  Distal pulses intact Respiratory: Effort is normal on 2.5L O2. Diminished lung sounds in all lung fields with faint bibasilar expiratory wheezing.  GI: Nondistended, soft, nontender to palpation, bowel sounds present Musculoskeletal: Normal bulk and tone.  No peripheral edema noted. Neurological: Is alert and oriented x4, no apparent focal deficits noted. Skin: Warm and dry.  Dry flaky skin in bilateral upper and lower extremities and back. No rash, erythema, lesions noted. Psychiatric: Normal mood and affect. Behavior is normal.   Pertinent Labs: CBC    Component Value Date/Time   WBC 13.9 (H) 03/21/2022 1737   RBC 4.37 03/21/2022 1737   HGB 12.9 (L) 03/21/2022 1737   HCT 40.2 03/21/2022 1737   PLT 488 (H) 03/21/2022 1737   MCV 92.0 03/21/2022 1737   MCH 29.5 03/21/2022 1737   MCHC 32.1 03/21/2022 1737   RDW 14.4 03/21/2022 1737   LYMPHSABS 2.6 03/21/2022 1737   MONOABS 0.7 03/21/2022 1737   EOSABS 0.2 03/21/2022 1737   BASOSABS 0.0 03/21/2022 1737      CMP     Component Value Date/Time   NA 137 03/21/2022 1737   K 4.0 03/21/2022 1737   CL 104 03/21/2022 1737   CO2 25 03/21/2022 1737   GLUCOSE 88 03/21/2022 1737   BUN 8 03/21/2022 1737   CREATININE 0.81 03/21/2022 1737   CALCIUM 8.7 (L) 03/21/2022 1737   PROT 6.0 (L) 03/21/2022 1737   ALBUMIN 2.7 (L) 03/21/2022 1737   AST 21 03/21/2022 1737   ALT 18 03/21/2022 1737   ALKPHOS 66 03/21/2022 1737   BILITOT 0.6 03/21/2022 1737   GFRNONAA >60 03/21/2022 1737    Pertinent Imaging: CT Head Wo Contrast  Result Date: 03/21/2022 CLINICAL DATA:  Altered mental status. EXAM: CT HEAD WITHOUT CONTRAST TECHNIQUE: Contiguous axial images were obtained from the base of the skull through the vertex without intravenous contrast. RADIATION DOSE REDUCTION: This exam was performed according to the departmental dose-optimization program which includes automated exposure control, adjustment of the mA and/or kV according to patient size and/or use of iterative reconstruction technique. COMPARISON:  None Available. FINDINGS: Brain: No evidence of intracranial hemorrhage, acute infarction, hydrocephalus, extra-axial collection, or mass lesion/mass effect. Mild diffuse cerebral atrophy and chronic small vessel disease noted. Vascular:  No hyperdense vessel or other acute findings. Skull: No  evidence of fracture or other significant bone abnormality. Sinuses/Orbits:  No acute findings. Other: None. IMPRESSION: No acute intracranial abnormality. 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 Electronically Signed   By: Marlaine Hind M.D.   On: 03/21/2022 18:13   DG Chest Port 1 View  Result Date: 03/21/2022 CLINICAL DATA:  Shortness of breath. EXAM: PORTABLE CHEST 1 VIEW COMPARISON:  Chest x-ray 03/06/2022 FINDINGS: The heart size and mediastinal contours are within normal limits. Small nodular density in the left lung apex is unchanged and likely calcified. The lungs are otherwise clear. IMPRESSION: No  active disease. Electronically Signed   By: Ronney Asters M.D.   On: 03/21/2022 18:46   DG Hip Unilat W or Wo Pelvis 2-3 Views Left  Result Date: 03/21/2022 CLINICAL DATA:  Shortness of breath, hip pain. EXAM: DG HIP (WITH OR WITHOUT PELVIS) 2-3V LEFT COMPARISON:  Pelvis radiograph dated 03/06/2022. FINDINGS: There is no evidence of hip fracture or dislocation. Mild bilateral hip osteoarthritis. Severe degenerative changes in the lumbar spine. Surgical clips throughout the pelvis. Stool is seen in the rectum. IMPRESSION: Degenerative changes without acute osseous injury. Electronically Signed   By: Zerita Boers M.D.   On: 03/21/2022 18:36    EKG: personally reviewed my interpretation is sinus tachycardia  ASSESSMENT & PLAN:  Assessment: Principal Problem:   COPD exacerbation (HCC)   Randy Bowen is a 81 y.o. with pertinent PMH of history of rheumatoid arthritis with ankylosing spondylitis on biologic therapy and chronic steroid therapy, psoriatic arthritis, prostate cancer, chronic hypoxic respiratory failure on 2L oxygen at baseline 2/2 severe COPD, drug induced osteoporosis with compression fractures, prostate cancer and basal cell carcinoma who presented with shortness of breath and lightheadedness/dizziness admit for COPD exacerbation on hospital day 0  Plan: #COPD exacerbation  #Hx of severe COPD #Chronic hypoxic respiratory failure  Patient is presenting with progressively worsening dyspnea on exertion. Reports that he is barely able to walk 68f without getting short of breath. He is on 2L oxygen at baseline and denies any increasing oxygen requirements. He reports having to use his albuterol inhaler twice weekly. Denies any chest pain, unable to clarify presence of cough.  He was evaluated by Dr DShearon Stallson 01/08/22 in office and advised to continue Spiriva and Wixela daily, repeat PFTs for referral to pulmonary rehab. Repeat PFTs with FEV1 32% of predicted. On examination, patient has  diminished lung sounds diffusely with faint bibasilar expiratory wheezing. He does have neutrophil predominant leukocytosis, but is afebrile without increasing oxygen requirements and no significant consolidation noted on chest x-ray, less concern for superimposed pneumonia at this time. Suspect that his leukocytosis may be due to demarginalization due to steroid use. He has received albuterol neb and IV solumedrol in the ED. Will hold off on IV antibiotics at this time; however, if worsening clinical status, will start on IV cefepime. - S/p IV solumedrol, start prednisone taper tomorrow - Dulera 2 puff bid and Incruse Ellipta 1 puff daily  - Duonebs q4h prn - Azithromycin '500mg'$  daily x3 days - Continue oxygen supplementation  - Ambulatory pulse ox  #Sinus tachycardia Patient is noted to have sinus tachycardia on cardiac monitoring. Per wife, patient's HR has been elevated to 120-130s at home for the past week even at rest. He does report being fairly active and denies prolonged periods of bedrest. He denies any chest pain or palpitations. No history of atrial fibrillation. He does endorse decreased oral intake, suspect that dehydration may be contributing to  his tachycardia. Patient was given 1L bolus in the ED. Given immunosuppressed state, will check blood cultures for infection although this is unlikely. Will check TSH and D-dimer; however, D-dimer may be unreliable in setting of underlying autoimmune conditions and malignancy. He does not appear to be in heart failure at this time but will check Echo to assess heart function. His COPD and chronic hypoxic respiratory failure may be contributing to his tachycardia although this is unlikely.  - TSH, D-dimer - Blood cultures x2 - Echo - Continue IVF through tonight and encourage PO intake - Cardiac monitoring   #Physical deconditioning #Protein-calorie malnutrition Patient is presenting for concerns of approximately 40lb weight loss over the past  year with progressively worsening decreased oral intake with lightheadedness/dizziness and concerns for dehydration over the past month. He endorses a fall last week in the shower with resultant head trauma and left hip and rib pain. CT Head negative for acute intracranial bleed and X-rays negative for acute fractures.  - PT/OT evaluation - Ensure twice daily between meals  - Consult to registered dietician  #Normocytic anemia # Thrombocytosis Patient noted to have mild normocytic anemia on labs and thrombocytosis. He does have autoimmune disorders and chronic steroid use which can contribute to this and has history of prostate cancer. He does endorse progressively worsening fatigue and peripheral neuropathy.  - Trend CBC - Peripheral smear - Iron studies, vitamin B12  #Hypocalcemia Patient is reportedly on calcium and vitamin D supplements at home. However, has had mildly low calcium on labs. He is asymptomatic at this time - Trend calcium - Check mag - If persistent, will check PTH levels   #Rheumatoid arthritis with ankylosing spondylitis # Psoriatic arthritis  #Compression fractures secondary to chronic steroid use # Erythrodermic psoriasis  Patient with history of rheumatoid arthritis with multiple joint involvement and psoriatic arthritis on biologic therapy and chronic steroid therapy witih '10mg'$  prednisone daily. He reports running out of his steroids three days ago. He does not recall who his rheumatologist is but does report that he has had trouble establishing care since moving to [place].  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  - Continue to monitor  Best Practice: Diet: Cardiac diet IVF: Fluids: 0.9NS, Rate:  1000 cc bolus VTE: enoxaparin (LOVENOX) injection 40 mg Start:  03/21/22 2200 Code: Full AB: Cefepime Status: Observation with expected length of stay less than 2 midnights. Anticipated Discharge Location: Home Barriers to Discharge: Medical stability  Signature: Harvie Heck, MD Internal Medicine Resident, PGY-3 [facility]  Pager: [phone] 10:09 PM, 03/21/2022   Please contact the on call pager after 5 pm and on weekends at [phone].

## 2022-03-22 ENCOUNTER — Encounter (HOSPITAL_COMMUNITY): Payer: Self-pay | Admitting: Internal Medicine

## 2022-03-22 ENCOUNTER — Observation Stay (HOSPITAL_COMMUNITY): Payer: No Typology Code available for payment source

## 2022-03-22 ENCOUNTER — Observation Stay (HOSPITAL_BASED_OUTPATIENT_CLINIC_OR_DEPARTMENT_OTHER): Payer: No Typology Code available for payment source

## 2022-03-22 ENCOUNTER — Other Ambulatory Visit (HOSPITAL_COMMUNITY): Payer: Self-pay

## 2022-03-22 ENCOUNTER — Other Ambulatory Visit: Payer: Self-pay

## 2022-03-22 DIAGNOSIS — J441 Chronic obstructive pulmonary disease with (acute) exacerbation: Secondary | ICD-10-CM

## 2022-03-22 DIAGNOSIS — Z87891 Personal history of nicotine dependence: Secondary | ICD-10-CM

## 2022-03-22 DIAGNOSIS — M459 Ankylosing spondylitis of unspecified sites in spine: Secondary | ICD-10-CM | POA: Insufficient documentation

## 2022-03-22 DIAGNOSIS — C4491 Basal cell carcinoma of skin, unspecified: Secondary | ICD-10-CM | POA: Insufficient documentation

## 2022-03-22 DIAGNOSIS — J449 Chronic obstructive pulmonary disease, unspecified: Secondary | ICD-10-CM

## 2022-03-22 DIAGNOSIS — L408 Other psoriasis: Secondary | ICD-10-CM | POA: Insufficient documentation

## 2022-03-22 DIAGNOSIS — H903 Sensorineural hearing loss, bilateral: Secondary | ICD-10-CM | POA: Insufficient documentation

## 2022-03-22 DIAGNOSIS — R0602 Shortness of breath: Secondary | ICD-10-CM

## 2022-03-22 DIAGNOSIS — M069 Rheumatoid arthritis, unspecified: Secondary | ICD-10-CM | POA: Insufficient documentation

## 2022-03-22 DIAGNOSIS — C61 Malignant neoplasm of prostate: Secondary | ICD-10-CM | POA: Insufficient documentation

## 2022-03-22 DIAGNOSIS — Z7952 Long term (current) use of systemic steroids: Secondary | ICD-10-CM

## 2022-03-22 LAB — IRON AND TIBC
Iron: 18 ug/dL — ABNORMAL LOW (ref 45–182)
Saturation Ratios: 7 % — ABNORMAL LOW (ref 17.9–39.5)
TIBC: 244 ug/dL — ABNORMAL LOW (ref 250–450)
UIBC: 226 ug/dL

## 2022-03-22 LAB — ECHOCARDIOGRAM COMPLETE
Area-P 1/2: 5.02 cm2
Calc EF: 61.3 %
Height: 74 in
S' Lateral: 2.8 cm
Single Plane A2C EF: 56.8 %
Single Plane A4C EF: 63 %
Weight: 1995.2 oz

## 2022-03-22 LAB — SAVE SMEAR(SSMR), FOR PROVIDER SLIDE REVIEW

## 2022-03-22 LAB — FERRITIN: Ferritin: 299 ng/mL (ref 24–336)

## 2022-03-22 LAB — PATHOLOGIST SMEAR REVIEW

## 2022-03-22 LAB — VITAMIN B12: Vitamin B-12: 252 pg/mL (ref 180–914)

## 2022-03-22 MED ORDER — AZITHROMYCIN 250 MG PO TABS
500.0000 mg | ORAL_TABLET | Freq: Every day | ORAL | 0 refills | Status: AC
  Filled 2022-03-22: qty 4, 2d supply, fill #0

## 2022-03-22 MED ORDER — IOHEXOL 350 MG/ML SOLN
100.0000 mL | Freq: Once | INTRAVENOUS | Status: AC | PRN
  Administered 2022-03-22: 100 mL via INTRAVENOUS

## 2022-03-22 MED ORDER — PERFLUTREN LIPID MICROSPHERE
1.0000 mL | INTRAVENOUS | Status: AC | PRN
  Administered 2022-03-22: 2 mL via INTRAVENOUS

## 2022-03-22 MED ORDER — IPRATROPIUM-ALBUTEROL 0.5-2.5 (3) MG/3ML IN SOLN
3.0000 mL | RESPIRATORY_TRACT | Status: DC
  Filled 2022-03-22 (×2): qty 3

## 2022-03-22 MED ORDER — PREDNISONE 10 MG PO TABS
ORAL_TABLET | ORAL | 0 refills | Status: AC
  Filled 2022-03-22: qty 90, 78d supply, fill #0

## 2022-03-22 NOTE — Discharge Summary (Shared)
Physician Discharge Summary  Patient ID: Randy Bowen MRN: [id number] DOB/AGE: Feb 25, 1941 81 y.o.  Admit date: 03/21/2022 Discharge date: 03/22/2022  Admission Diagnoses: COPD exacerbation  Discharge Diagnoses: COPD exacerbation  Hospital Course: Randy Bowen is a 81 y.o. male with a history of rheumatoid arthritis, erythrodermic psoriasis, ankylosing spondylitis, chronic systemic corticosteroid use, and severe COPD who presented with SOB and generalized malaise and was admitted for COPD exacerbation. Hospital course is outline by problem below.   ED Course Patient presented with worsening SOB and persistent L hip and rib pain in setting of recent fall. Patient was tachycardic and afebrile on arrival and was satting 98% on his baseline 2L of supplemental O2. ED labs notable for CBC with mild leukocytosis of 13.9 and Hgb 12.9, troponins 47 -> 21, BNP 25.4, negative flu and COVID, EKG sinus tachycardia. CXR wnl, hip XR and CT head negative. He received IV solumedrol x1 and albuterol.   COPD exacerbation Hx of severe COPD   Transitioned from IV solumedrol to prednisone 40 mg and given duonebs and azithromycin. Elevated d-dimer to 12.7, CTa negative for PE but showed left lower lobe airspace disease concerning for pneumonia. Blood cultures with no growth at 12 hours. Patient remained afebrile, low clinical suspicion for pneumonia. He was discharged with instructions to complete a 5 day course of prednisone 40 mg, then switch to his home regimen of prednisone 10 mg daily, and 3-day course of azithromycin.   Sinus tachycardia Intermittently tachycardic with some response to IV fluids. Placed on continuous telemetry, remained in sinus rhythm during his stay. TSH wnl, Echo with LV EF of 55-60%, mildly elevated pulmonary artery systolic pressure, mild to moderate tricuspid regurgitation, and borderline dilatation of the ascending aorta at 36 mm.   Erythrodermic psoriasis  Patient recently  transferred Rheumatology care from Randy Bowen to [place]. Was taking Skyrizi but it caused painful joints so he restarted Randy Bowen. Saw new Rheumatologist Dr.  Asher Bowen once in February 2023. Instructed to follow-up with outpatient Rheum regarding psoriasis medication.    Discharge Exam: Blood pressure 125/67, pulse 81, temperature 98.5 F (36.9 C), temperature source Oral, resp. rate 16, height '6\' 2"'$  (1.88 m), weight 56.6 kg, SpO2 96 %. 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 Skin: Dry, flaking skin present on bilateral arms and face Neurologic: Mental status: Alert, oriented, thought content appropriate  Disposition: Discharge disposition: 06-Home-Health Care Svc      Home with HHOT and PT, already established through [place] Discharge Instructions     Diet - low sodium heart healthy   Complete by: As directed    Increase activity slowly   Complete by: As directed       Allergies as of 03/22/2022       Reactions   Skyrizi [risankizumab] Nausea And Vomiting        Medication List     TAKE these medications    azithromycin 250 MG tablet Commonly known as: ZITHROMAX Take 2 tablets (500 mg total) by mouth daily for 2 days.   Calcium Carb-Cholecalciferol 500-10 MG-MCG Tabs Take 2 tablets by mouth daily.   Cholecalciferol 25 MCG (1000 UT) tablet Take 2 tablets by mouth daily.   HYDROcodone-acetaminophen 5-325 MG tablet Commonly known  as: NORCO/VICODIN Take 2 tablets by mouth every 4 (four) hours as needed.   Ixekizumab 80 MG/ML Soaj INJECT '160MG'$  (2 AUTOINJECTORS) SUBCUTANEOUSLY ONCE FOR PSORIASIS   lidocaine 5 % Commonly known as:  LIDODERM APPLY 1 PATCH TO SKIN ONCE DAILY (APPLY FOR 12 HOURS, THEN REMOVE FOR 12 HOURS)   naproxen 375 MG tablet Commonly known as: NAPROSYN Take 1 tablet (375 mg total) by mouth 2 (two) times daily.   prednisoLONE Sodium Phosphate Powd See admin instructions.   predniSONE 10 MG tablet Commonly known as: DELTASONE Take 4 tablets (40 mg total) by mouth daily with breakfast for 4 days, THEN 1 tablet (10 mg total) daily with breakfast. Start taking on: Mar 22, 2022 What changed:  See the new instructions. Another medication with the same name was removed. Continue taking this medication, and follow the directions you see here.   Spiriva Respimat 2.5 MCG/ACT Aers Generic drug: Tiotropium Bromide Monohydrate Inhale into the lungs.   Wixela Inhub 500-50 MCG/ACT Aepb Generic drug: fluticasone-salmeterol Inhale 1 puff into the lungs in the morning and at bedtime.        Follow-up Information     Randy Mallow, MD Follow up.   Specialty: Internal Medicine Contact information: [place] [id number] [facility]. Go on 04/12/2022.   Specialty: Pulmonology Why: 11 AM Contact information: [place] [address] [phone]                Signed: Stefani Dama 03/22/2022, 2:39 PM

## 2022-03-22 NOTE — Progress Notes (Addendum)
Hospital day: 1  Subjective:   Patient Summary: Randy Bowen is a 81 y.o. 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   Overnight events: No acute events.  Patient seen at bedside during morning rounds. Randy Bowen reports that his cough felt better after receiving breathing treatments yesterday. He states that in the past couple of weeks, his cough has been more productive. He denies pleuritic chest pain but notes that he has pain at his left ribs where he fell recently and has occasional . He states that his current biologic is [name] (tofacitinib), and that he was on Skyrizi previously in March 2023 which caused severe joint/muscle pain and also affected his memory. He states that he sees Dermatology and Rheumatology in [place] through the [place]. He endorses SOB when ambulating short distances.   Objective:  Vital signs in last 24 hours: Vitals:   03/22/22 0100 03/22/22 0213 03/22/22 0505 03/22/22 0825  BP: 102/68 102/73 (!) 91/56 117/76  Pulse: 84 88 69 89  Resp: '20 20  16  '$ Temp: 98.9 F (37.2 C) 98.1 F (36.7 C) (!) 97.4 F (36.3 C) 97.8 F (36.6 C)  TempSrc: Oral Oral Oral Oral  SpO2: 92% 99% 98% 99%  Weight:  56.6 kg    Height:  '6\' 2"'$  (1.88 m)      Filed Weights   03/21/22 1742 03/22/22 0213  Weight: 61.7 kg 56.6 kg     Intake/Output Summary (Last 24 hours) at 03/22/2022 0835 Last data filed at 03/22/2022 0827 Gross per 24 hour  Intake 240 ml  Output 200 ml  Net 40 ml   Net IO Since Admission: 40 mL [03/22/22 0835]   Pertinent Labs:    Latest Ref Rng & Units 03/21/2022    5:37 PM 03/06/2022   12:07 PM  CBC  WBC 4.0 - 10.5 K/uL 13.9   12.2     Hemoglobin 13.0 - 17.0 g/dL 12.9   13.7    Hematocrit 39.0 - 52.0 % 40.2   43.7    Platelets 150 - 400 K/uL 488   473         Latest Ref Rng & Units 03/21/2022    5:37 PM 03/06/2022   12:07 PM  CMP  Glucose 70 - 99 mg/dL 88   88    BUN 8 - 23 mg/dL 8   8    Creatinine 0.61 - 1.24 mg/dL 0.81   0.88    Sodium 135 - 145 mmol/L 137   137    Potassium 3.5 - 5.1 mmol/L 4.0   3.6    Chloride 98 - 111 mmol/L 104   101    CO2 22 - 32 mmol/L 25   26    Calcium 8.9 - 10.3 mg/dL 8.7   8.8    Total Protein 6.5 - 8.1 g/dL 6.0   6.4    Total Bilirubin 0.3 - 1.2 mg/dL 0.6   1.2    Alkaline Phos 38 - 126 U/L 66   59    AST 15 - 41 U/L 21   23    ALT 0 - 44 U/L 18   19      No results for input(s): GLUCAP in the last 72 hours.  Imaging: CT Head Wo Contrast  Result Date: 03/21/2022 CLINICAL DATA:  Altered mental status. EXAM: CT HEAD WITHOUT CONTRAST TECHNIQUE: Contiguous axial images were obtained from the base of the skull through the vertex without intravenous contrast. RADIATION DOSE REDUCTION: This exam was performed according to the departmental dose-optimization program which includes automated exposure control, adjustment of the mA and/or kV according to patient size and/or use of iterative reconstruction technique. COMPARISON:  None Available. FINDINGS: Brain: No evidence of intracranial hemorrhage, acute infarction, hydrocephalus, extra-axial collection, or mass lesion/mass effect. Mild diffuse cerebral atrophy and chronic small vessel disease noted. Vascular:  No hyperdense vessel or other acute findings. Skull: No evidence of fracture or other significant bone abnormality. Sinuses/Orbits:  No acute findings. Other: None. IMPRESSION: No acute intracranial abnormality. 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 Electronically Signed   By: Marlaine Hind M.D.   On: 03/21/2022 18:13   DG Chest Port 1 View  Result Date: 03/21/2022 CLINICAL DATA:  Shortness of breath. EXAM: PORTABLE  CHEST 1 VIEW COMPARISON:  Chest x-ray 03/06/2022 FINDINGS: The heart size and mediastinal contours are within normal limits. Small nodular density in the left lung apex is unchanged and likely calcified. The lungs are otherwise clear. IMPRESSION: No active disease. Electronically Signed   By: Ronney Asters M.D.   On: 03/21/2022 18:46   DG Hip Unilat W or Wo Pelvis 2-3 Views Left  Result Date: 03/21/2022 CLINICAL DATA:  Shortness of breath, hip pain. EXAM: DG HIP (WITH OR WITHOUT PELVIS) 2-3V LEFT COMPARISON:  Pelvis radiograph dated 03/06/2022. FINDINGS: There is no evidence of hip fracture or dislocation. Mild bilateral hip osteoarthritis. Severe degenerative changes in the lumbar spine. Surgical clips throughout the pelvis. Stool is seen in the rectum. IMPRESSION: Degenerative changes without acute osseous injury. Electronically Signed   By: Zerita Boers M.D.   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   Principal Problem:   COPD exacerbation ([place])  # COPD exacerbation  # Hx of severe COPD  Sees [facility], recent PFTs from April 2023 with FEV1 32%. Checking Echo due to worsening SOB as well as  inability to sleep lying flat, although I think most likely this is due to his severe COPD as opposed to heart failure - he has no LE edema, JVP, or crackles on exam. D-dimer was elevated at 12.74, this may be non-specific due to his multiple autoimmune/inflammatory processes but as he remains tachycardic and endorses intermittent chest pain, will obtain CTa to rule out PE.  - f/u CTa  - f/u Echo  - s/p IV solumedrol on 5/21, start prednisone taper today  - Dulera 2 puffs BID and Incruse Ellipta 1 puff daily  - Duonebs q4h as needed  - Azithro 500 mg daily  x 3 days (end 5/23) - Continue O2 supplementation - Ambulatory pulse ox  - Outpatient Pulmonology appt 04/12/22  # AAA  Measured at 4.2 cm on CTa from 01/14/22. Will monitor for interval growth with CTa.  - f/u CTa  # Sinus tachycardia TSH wnl, d-dimer slightly elevated so obtaining CTa. HR somewhat improved with IVF but remains intermittently tachycardic. Checking CTa and Echo as above. - f/u CTA and Echo  - f/u blood cultures  - Cardiac monitoring   # Protein-calorie malnutrition  # Physical deconditioning  # Weight loss  Outpatient provider strongly recommended colonoscopy in April 2023 due to recent changes in BM and increased constipation and weight loss of 45 lbs in 8 months but patient declined. Last colonoscopy apparently 20+ years ago in [place], but chart review indicates that patient has completed stool screening within the last 5 years in [place] with negative results.  - Ensure supplement BID between meals  - Nutrition consulted  - PT/OT consulted   # Anemia of chronic disease  Iron low at 18, 7% saturation, TIBC also low at 244 consistent with anemia of chronic disease. Low suspicion for active infection but we do have blood cultures pending so will consider administering a dose of IV iron while he is here as long as cultures are negative. Ferritin 299 which may be elevated due to his autoimmune disorders. Vit B12 252,  peripheral smear pending.  - f/u peripheral smear  - Trend CBC   - Consider IV iron x1 dose   # Hypocalcemia  Ca 8.7, albumin 2.7, corrected Ca is wnl at 9.7. Patient reportedly takes calcium and vit D supplements at home.   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 He reports running out of his steroids three days ago. He was seen once by new Rheumatologist Dr. Sherril Cong on 12/16/21, and their note indicates that [name] was stopped. Patient reports [name] is current medication and that he also takes prednisone 10 mg daily.  - Norco 5-'325mg'$  every 6 hours as needed for severe pain - Advise for slow steroid taper as outpatient on discharge - Rheumatology follow up on discharge  Diet: Regular IVF: LR at 100 mL/hr VTE: Lovenox Antibiotics: Azithromycin  CODE: Home  Prior to Admission Living Arrangement: Home Anticipated Discharge Location: Home Barriers to Discharge: Medical stabililty Dispo: Anticipated discharge in 1-2 days   Signed: Sabino Dick, MS4 03/22/2022, 8:35 AM  Internal Medicine Teaching Service [phone] Please contact the on call pager after 5 pm and on weekends at [phone].

## 2022-03-22 NOTE — Plan of Care (Signed)

## 2022-03-22 NOTE — TOC Transition Note (Signed)
Transition of Care [facility]) - CM/SW Discharge Note   Patient Details  Name: Randy Bowen MRN: [id number] Date of Birth: Feb 28, 1941  [facility] CM/SW Contact:  Zenon Mayo, RN Phone Number: 03/22/2022, 3:27 PM   Clinical Narrative:    Patient is for dc today, he is active with [facility] for HHPT, HHOT he would like to continue with them.  He states his wife will t ransport him home around 5 pm today.   Final next level of care: Home w Home Health Services Barriers to Discharge: No Barriers Identified   Patient Goals and CMS Choice Patient states their goals for this hospitalization and ongoing recovery are:: return home with wife CMS Medicare.gov Compare Post Acute Care list provided to:: Patient Choice offered to / list presented to : Patient  Discharge Placement                       Discharge Plan and Services                  DME Agency: NA       HH Arranged: OT, PT [place] Agency: [place] Date [place]: 03/22/22 Time [place]: [id number] Representative spoke with at [place]: Tommi Rumps  Social Determinants of Health ([place]) Interventions     Readmission Risk Interventions     View : No data to display.

## 2022-03-22 NOTE — Progress Notes (Signed)
Patient is alert and oriented x 4, denies pain and is ready to discharge home. His wife is here to pick him up at this time. No signs nor symptoms of distress nor discomfort noted. VS are within normal limits. Pt is stable for discharge at this time.

## 2022-03-22 NOTE — Discharge Summary (Signed)
Name: Randy Bowen MRN: [id number] DOB: [birth year] 81 y.o. PCP: Randy Mallow, MD  Date of Admission: 03/21/2022  5:20 PM Date of Discharge: 03/22/2022 Attending Physician: Dr.  Cain Bowen  Discharge Diagnosis: Principal Problem:   COPD exacerbation [facility]) Active Problems:   COPD, severe (Randy Bowen)   Long term (current) use of systemic steroids    Discharge Medications: Allergies as of 03/22/2022       Reactions   Skyrizi [risankizumab] Nausea And Vomiting        Medication List     TAKE these medications    azithromycin 250 MG tablet Commonly known as: ZITHROMAX Take 2 tablets (500 mg total) by mouth daily for 2 days.   Calcium Carb-Cholecalciferol 500-10 MG-MCG Tabs Take 2 tablets by mouth daily.   Cholecalciferol 25 MCG (1000 UT) tablet Take 2 tablets by mouth daily.   HYDROcodone-acetaminophen 5-325 MG tablet Commonly known as: NORCO/VICODIN Take 2 tablets by mouth every 4 (four) hours as needed.   Ixekizumab 80 MG/ML Soaj INJECT '160MG'$  (2 AUTOINJECTORS) SUBCUTANEOUSLY ONCE FOR Bowen   lidocaine 5 % Commonly known as: LIDODERM APPLY 1 PATCH TO SKIN ONCE DAILY (APPLY FOR 12 HOURS, THEN REMOVE FOR 12 HOURS)   naproxen 375 MG tablet Commonly known as: NAPROSYN Take 1 tablet (375 mg total) by mouth 2 (two) times daily.   prednisoLONE Sodium Phosphate Powd See admin instructions.   predniSONE 10 MG tablet Commonly known as: DELTASONE Take 4 tablets (40 mg total) by mouth daily with breakfast for 4 days, THEN 1 tablet (10 mg total) daily with breakfast. Start taking on: Mar 22, 2022 What changed:  See the new instructions. Another medication with the same name was removed. Continue taking this medication, and follow the directions you see here.   Spiriva Respimat 2.5 MCG/ACT Aers Generic drug: Tiotropium Bromide Monohydrate Inhale into the lungs.   Wixela Inhub 500-50 MCG/ACT Aepb Generic drug: fluticasone-salmeterol Inhale 1 puff into the lungs in  the morning and at bedtime.        Disposition and follow-up:   [name] was discharged from [facility] in Stable condition.  At the Bowen follow up visit please address:  1.  Follow-up:  a. COPD Exacerbation: Patient had a brief hospitalization for this. He was discharged with 2 more days of Azithromycin and 4 more days of prednisone 40 mg. Please re-evaluate his respiratory symptoms and ensure he is on the appropriate inhalers.     b. Randy Bowen: Patient instructed to contact his Rheumatologist Dr. Asher [facility]en at the [facility] to make a Bowen follow-up appointment so she can prescribe the appropriate medications.    2.  Labs / imaging needed at time of follow-up: None  3.  Pending labs/ test needing follow-up: None  Follow-up Appointments:  Follow-up Information     Randy Mallow, MD Follow up.   Specialty: Internal Medicine Contact information: Randy Bowen. Go on 04/12/2022.   Specialty: Pulmonology Why: 11 AM Contact information: Randy Bowen Course by problem list: [name] is a 81 y.o. male with a history of rheumatoid arthritis, Randy Bowen, ankylosing spondylitis, chronic systemic corticosteroid use, and severe COPD who presented with SOB and generalized malaise and was admitted for COPD exacerbation. Bowen course  is outline by problem below.   ED Course Patient presented with worsening SOB and persistent L hip and rib pain in setting of recent fall. Patient was tachycardic and afebrile on arrival and was satting 98% on his baseline 2L of supplemental O2. ED labs notable for CBC with mild leukocytosis of 13.9 and Hgb 12.9, troponins 47 -> 21, BNP 25.4, negative flu and COVID, EKG sinus tachycardia. CXR wnl, hip XR and  CT head negative. He received IV solumedrol x1 and albuterol.   COPD exacerbation Hx of severe COPD   Transitioned from IV solumedrol to prednisone 40 mg and given duonebs and azithromycin. Elevated d-dimer to 12.7, CTa negative for PE but showed left lower lobe airspace disease concerning for pneumonia. Blood cultures with no growth at 12 hours. Patient remained afebrile, low clinical suspicion for pneumonia. He was discharged with instructions to complete a 5 day course of prednisone 40 mg, then switch to his home regimen of prednisone 10 mg daily, and 3-day course of azithromycin.   Sinus tachycardia Intermittently tachycardic with some response to IV fluids. Placed on continuous telemetry, remained in sinus rhythm during his stay. TSH wnl, Echo with LV EF of 55-60%, mildly elevated pulmonary artery systolic pressure, mild to moderate tricuspid regurgitation, and borderline dilatation of the ascending aorta at 36 mm.   [facility]en  Patient recently transferred Rheumatology [facility] from [facility] to [facility]. Was taking Skyrizi but it caused painful joints so he restarted [name]. Saw new Rheumatologist Dr.  Asher Bowen once in February 2023. Instructed to follow-up with outpatient Rheum regarding Bowen medication.    Subjective:  Patient evaluated at the bedside laying comfortably in bed. He states his breathing is much better after he received the nebulized treatments. He still has occasional cough but this has improved as well. He has been tolerating fluids and food. Patient re-evaluated in the afternoon after CTA and Echocardiogram and reported feeling well enough to go home.   Discharge Vitals:   BP 125/67 (BP Location: Right Arm)   Pulse 81   Temp 98.5 F (36.9 C) (Oral)   Resp 16   Ht '6\' 2"'$  (1.88 m)   Wt 56.6 kg   SpO2 96%   BMI 16.01 kg/m  Discharge Exam: Blood pressure 125/67, pulse 81, temperature 98.5 F (36.9 C), temperature source Oral, resp. rate  16, height '6\' 2"'$  (1.88 m), weight 56.6 kg, SpO2 96 %. 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 Skin: Dry, flaking skin present on bilateral arms and face Neurologic: Mental status: Alert, oriented, thought content appropriate  Disposition:  Home with HHOT and PT, already established through [place]  Pertinent Labs, Studies, and Procedures:     Latest Ref Rng & Units 03/21/2022    5:37 PM 03/06/2022   12:07 PM  CBC  WBC 4.0 - 10.5 K/uL 13.9   12.2    Hemoglobin 13.0 - 17.0 g/dL 12.9   13.7    Hematocrit 39.0 - 52.0 % 40.2   43.7    Platelets 150 - 400 K/uL 488   473         Latest Ref Rng & Units 03/21/2022    5:37 PM 03/06/2022   12:07 PM  CMP  Glucose  70 - 99 mg/dL 88   88    BUN 8 - 23 mg/dL 8   8    Creatinine 0.61 - 1.24 mg/dL 0.81   0.88    Sodium 135 - 145 mmol/L 137   137    Potassium 3.5 - 5.1 mmol/L 4.0   3.6    Chloride 98 - 111 mmol/L 104   101    CO2 22 - 32 mmol/L 25   26    Calcium 8.9 - 10.3 mg/dL 8.7   8.8    Total Protein 6.5 - 8.1 g/dL 6.0   6.4    Total Bilirubin 0.3 - 1.2 mg/dL 0.6   1.2    Alkaline Phos 38 - 126 U/L 66   59    AST 15 - 41 U/L 21   23    ALT 0 - 44 U/L 18   19      CT Head Wo Contrast  Result Date: 03/21/2022 CLINICAL DATA:  Altered mental status. EXAM: CT HEAD WITHOUT CONTRAST TECHNIQUE: Contiguous axial images were obtained from the base of the skull through the vertex without intravenous contrast. RADIATION DOSE REDUCTION: This exam was performed according to the departmental dose-optimization program which includes automated exposure control, adjustment of the mA and/or kV according to patient size and/or use of iterative  reconstruction technique. COMPARISON:  None Available. FINDINGS: Brain: No evidence of intracranial hemorrhage, acute infarction, hydrocephalus, extra-axial collection, or mass lesion/mass effect. Mild diffuse cerebral atrophy and chronic small vessel disease noted. Vascular:  No hyperdense vessel or other acute findings. Skull: No evidence of fracture or other significant bone abnormality. Sinuses/Orbits:  No acute findings. Other: None. IMPRESSION: No acute intracranial abnormality. 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 Electronically Signed   By: Marlaine Hind M.D.   On: 03/21/2022 18:13   CT Angio Chest Pulmonary Embolism (PE) W or WO Contrast  Result Date: 03/22/2022 CLINICAL DATA:  Chest pain, shortness of breath EXAM: CT ANGIOGRAPHY CHEST WITH CONTRAST TECHNIQUE: Multidetector CT imaging of the chest was performed using the standard protocol during bolus administration of intravenous contrast. Multiplanar CT image reconstructions and MIPs were obtained to evaluate the vascular anatomy. RADIATION DOSE REDUCTION: This exam was performed according to the departmental dose-optimization program which includes automated exposure control, adjustment of the mA and/or kV according to patient size and/or use of iterative reconstruction technique. CONTRAST:  121m OMNIPAQUE IOHEXOL 350 MG/ML SOLN COMPARISON:  Chest x-ray 03/06/2022 FINDINGS: Cardiovascular: Satisfactory opacification of the pulmonary arteries to the segmental level. No evidence of pulmonary embolism. Normal heart size. No pericardial effusion. Thoracic aortic atherosclerosis. Multi vessel coronary artery atherosclerosis. Mediastinum/Nodes: No enlarged mediastinal, hilar, or axillary lymph nodes. Thyroid gland, trachea, and esophagus demonstrate no significant findings. Lungs/Pleura: Bilateral centrilobular and paraseptal emphysema. 5.7 mm calcified left lower lobe, superior segment, pulmonary nodule likely reflecting sequela prior  granulomatous disease. Left lower lobe airspace disease concerning for pneumonia. Right upper lobe scarring peripherally. No pleural effusion or pneumothorax. Upper Abdomen: No acute upper abdominal abnormality. Calcifications in the spleen and liver as can be seen with prior granulomatous disease. Musculoskeletal: Age-indeterminate T4, T5, T6 and T8 vertebral body compression fractures. Generalized osteopenia. No acute fracture or dislocation. No aggressive osseous lesion. Review of the MIP images confirms the above findings. IMPRESSION: 1. No pulmonary embolism. 2. Left lower lobe airspace disease concerning for pneumonia. 3. Age-indeterminate T4, T5, T6 and T8 vertebral body compression fractures. These are unchanged compared to 03/06/2022. 4. Aortic Atherosclerosis (  ICD10-I70.0) and Emphysema (ICD10-J43.9). Electronically Signed   By: Kathreen Devoid M.D.   On: 03/22/2022 11:32   DG Chest Port 1 View  Result Date: 03/21/2022 CLINICAL DATA:  Shortness of breath. EXAM: PORTABLE CHEST 1 VIEW COMPARISON:  Chest x-ray 03/06/2022 FINDINGS: The heart size and mediastinal contours are within normal limits. Small nodular density in the left lung apex is unchanged and likely calcified. The lungs are otherwise clear. IMPRESSION: No active disease. Electronically Signed   By: Ronney Asters M.D.   On: 03/21/2022 18:46   ECHOCARDIOGRAM COMPLETE  Result Date: 03/22/2022    ECHOCARDIOGRAM REPORT   Patient Name:   Randy Bowen Date of Exam: 03/22/2022 Medical Rec #:  [id number]         Height:       74.0 in Accession #:    [id number]        Weight:       124.7 lb Date of Birth:  1941-03-29         BSA:          1.778 m Patient Age:    38 years          BP:           91/56 mmHg Patient Gender: M                 HR:           91 bpm. Exam Location:  Inpatient Procedure: 2D Echo, Cardiac Doppler, Color Doppler and Intracardiac            Opacification Agent Indications:    R06.02 SOB  History:        Patient has no prior  history of Echocardiogram examinations.                 COPD; Signs/Symptoms:Shortness of Breath.  Sonographer:    Roseanna Rainbow RDCS Referring Phys: [id number] [facility]  Sonographer Comments: Technically difficult study due to poor echo windows. Very difficult study due to thin habitus and extremely low windows. Difficult doppler signal due to perpendicular angle. IMPRESSIONS  1. Difficult study due to poor echo windows.  2. Left ventricular ejection fraction, by estimation, is 55 to 60%. The left ventricle has normal function. The left ventricle has no regional wall motion abnormalities. Left ventricular diastolic parameters are consistent with Grade I diastolic dysfunction (impaired relaxation).  3. Right ventricular systolic function is normal. The right ventricular size is normal. There is mildly elevated pulmonary artery systolic pressure. The estimated right ventricular systolic pressure is 76.2 mmHg.  4. The mitral valve is normal in structure. No evidence of mitral valve regurgitation.  5. Tricuspid valve regurgitation is mild to moderate.  6. The aortic valve was not well visualized. Aortic valve regurgitation is trivial. Aortic valve sclerosis/calcification is present, without any evidence of aortic stenosis.  7. Aortic dilatation noted. There is borderline dilatation of the ascending aorta, measuring 36 mm.  8. The inferior vena cava is dilated in size with >50% respiratory variability, suggesting right atrial pressure of 8 mmHg. FINDINGS  Left Ventricle: Left ventricular ejection fraction, by estimation, is 55 to 60%. The left ventricle has normal function. The left ventricle has no regional wall motion abnormalities. Definity contrast agent was given IV to delineate the left ventricular  endocardial borders. The left ventricular internal cavity size was normal in size. There is no left ventricular hypertrophy. Left ventricular diastolic parameters are consistent with Grade I diastolic dysfunction  (impaired relaxation).  Right Ventricle: The right ventricular size is normal. No increase in right ventricular wall thickness. Right ventricular systolic function is normal. There is mildly elevated pulmonary artery systolic pressure. The tricuspid regurgitant velocity is 2.93  m/s, and with an assumed right atrial pressure of 8 mmHg, the estimated right ventricular systolic pressure is 37.8 mmHg. Left Atrium: Left atrial size was not well visualized. Right Atrium: Right atrial size was not well visualized. Pericardium: There is no evidence of pericardial effusion. Mitral Valve: The mitral valve is normal in structure. No evidence of mitral valve regurgitation. Tricuspid Valve: The tricuspid valve is normal in structure. Tricuspid valve regurgitation is mild to moderate. Aortic Valve: The aortic valve was not well visualized. Aortic valve regurgitation is trivial. Aortic valve sclerosis/calcification is present, without any evidence of aortic stenosis. Pulmonic Valve: The pulmonic valve was normal in structure. Pulmonic valve regurgitation is trivial. Aorta: Aortic dilatation noted. There is borderline dilatation of the ascending aorta, measuring 36 mm. Venous: The inferior vena cava is dilated in size with greater than 50% respiratory variability, suggesting right atrial pressure of 8 mmHg. IAS/Shunts: The atrial septum is grossly normal.  LEFT VENTRICLE PLAX 2D LVIDd:         4.00 cm     Diastology LVIDs:         2.80 cm     LV e' medial:    6.53 cm/s LV PW:         0.80 cm     LV E/e' medial:  7.7 LV IVS:        0.80 cm     LV e' lateral:   6.53 cm/s LVOT diam:     2.00 cm     LV E/e' lateral: 7.7 LV SV:         32 LV SV Index:   18 LVOT Area:     3.14 cm  LV Volumes (MOD) LV vol d, MOD A2C: 89.4 ml LV vol d, MOD A4C: 87.3 ml LV vol s, MOD A2C: 38.6 ml LV vol s, MOD A4C: 32.3 ml LV SV MOD A2C:     50.8 ml LV SV MOD A4C:     87.3 ml LV SV MOD BP:      55.8 ml RIGHT VENTRICLE             IVC RV S prime:     11.00  cm/s  IVC diam: 2.20 cm TAPSE (M-mode): 1.5 cm LEFT ATRIUM             Index       RIGHT ATRIUM          Index LA diam:        1.90 cm 1.07 cm/m  RA Area:     7.04 cm LA Vol (A2C):   4.4 ml  2.50 ml/m  RA Volume:   11.30 ml 6.36 ml/m LA Vol (A4C):   4.9 ml  2.77 ml/m LA Biplane Vol: 5.1 ml  2.86 ml/m  AORTIC VALVE LVOT Vmax:   75.30 cm/s LVOT Vmean:  35.800 cm/s LVOT VTI:    0.101 m  AORTA Ao Root diam: 3.60 cm Ao Asc diam:  3.60 cm MITRAL VALVE               TRICUSPID VALVE MV Area (PHT): 5.02 cm    TR Peak grad:   34.3 mmHg MV Decel Time: 151 msec    TR Vmax:        293.00 cm/s MV  E velocity: 50.40 cm/s MV A velocity: 67.40 cm/s  SHUNTS MV E/A ratio:  0.75        Systemic VTI:  0.10 m                            Systemic Diam: 2.00 cm Gwyndolyn Kaufman MD Electronically signed by Gwyndolyn Kaufman MD Signature Date/Time: 03/22/2022/12:56:49 PM    Final    DG Hip Unilat W or Wo Pelvis 2-3 Views Left  Result Date: 03/21/2022 CLINICAL DATA:  Shortness of breath, hip pain. EXAM: DG HIP (WITH OR WITHOUT PELVIS) 2-3V LEFT COMPARISON:  Pelvis radiograph dated 03/06/2022. FINDINGS: There is no evidence of hip fracture or dislocation. Mild bilateral hip osteoarthritis. Severe degenerative changes in the lumbar spine. Surgical clips throughout the pelvis. Stool is seen in the rectum. IMPRESSION: Degenerative changes without acute osseous injury. Electronically Signed   By: Zerita Boers M.D.   On: 03/21/2022 18:36     Discharge Instructions: Discharge Instructions     Diet - low sodium heart healthy   Complete by: As directed    Increase activity slowly   Complete by: As directed       You were hospitalized for worsening of your COPD. Please take prednisone 40 mg daily through Mar 26, 2022, then resume your normal prednisone 10 mg daily. Please take the azithromycin 500 mg daily for a total of 3 days, last dose on Mar 24, 2022. Continue your daily COPD inhalers.   For your Randy Bowen,  please contact your Rheumatologist Dr. Asher Bowen at the [facility] to make a Bowen follow-up appointment so she can prescribe you the appropriate medications.   Signed: Lacinda Axon, MD 03/22/2022, 2:36 PM   Pager: [phone]

## 2022-03-22 NOTE — Evaluation (Signed)
Physical Therapy Evaluation Patient Details Name: Randy Bowen MRN: [id number] DOB: 02/15/1941 Today's Date: 03/22/2022  History of Present Illness  Pt is a 81 y/o M presenting to ED on 5/21 from home with SOB after falling last week, L rib and hip pain, reports blurry vision with increased activity. PMH includes RA, psoriatic arthritis, COPD.   Clinical Impression  Pt in bed upon arrival of PT, agreeable to evaluation at this time. Prior to admission the pt was mobilizing with use of rollator in the home since fall ~1 week ago, working with [place]. The pt now presents with limitations in functional mobility, strength, stability, power, and endurance due to above dx, and will continue to benefit from skilled PT to address these deficits. The pt was able to complete sit-stands and a short bout of hallway ambulation with minG for safety and use of RW. He had no overt LOB, but maintains trunk flexion and heavy dependence on BUE support, SpO2 >91% on RA but HR to max of 134bpm.  Given assist available at home, recommend return home with HHPT continuing, will continue to monitor O2 needs with exertion.         Recommendations for follow up therapy are one component of a multi-disciplinary discharge planning process, led by the attending physician.  Recommendations may be updated based on patient status, additional functional criteria and insurance authorization.  Follow Up Recommendations Home health PT    Assistance Recommended at Discharge Frequent or constant Supervision/Assistance  Patient can return home with the following  A little help with walking and/or transfers;A little help with bathing/dressing/bathroom;Assistance with cooking/housework;Direct supervision/assist for medications management;Direct supervision/assist for financial management;Assist for transportation;Help with stairs or ramp for entrance    Equipment Recommendations None recommended by PT  Recommendations for Other  Services       Functional Status Assessment Patient has had a recent decline in their functional status and demonstrates the ability to make significant improvements in function in a reasonable and predictable amount of time.     Precautions / Restrictions Precautions Precautions: Fall Precaution Comments: watch O2, on RA through session, tachy Restrictions Weight Bearing Restrictions: No      Mobility  Bed Mobility Overal bed mobility: Needs Assistance             General bed mobility comments: pt OOB with OT upon arrival    Transfers Overall transfer level: Needs assistance Equipment used: Rolling walker (2 wheels) Transfers: Sit to/from Stand Sit to Stand: Min guard           General transfer comment: poor eccentric control to lower, cues for hand placement and not to sit early    Ambulation/Gait Ambulation/Gait assistance: Min guard Gait Distance (Feet): 100 Feet Assistive device: Rolling walker (2 wheels) Gait Pattern/deviations: Step-through pattern, Decreased stride length, Trunk flexed Gait velocity: decreased     General Gait Details: pt with trunk flexed and heavy dependence on BUE support, SpO2 >91% with gait on RA, HR to 134bpm. No change in HR or SOB with addition of 2L O2    Balance Overall balance assessment: Needs assistance Sitting-balance support: Bilateral upper extremity supported, Feet supported Sitting balance-Leahy Scale: Good Sitting balance - Comments: can reach down towards feet to pull up pants   Standing balance support: During functional activity, Reliant on assistive device for balance Standing balance-Leahy Scale: Poor Standing balance comment: reliant on RW support  Pertinent Vitals/Pain Pain Assessment Pain Assessment: Faces Faces Pain Scale: Hurts a little bit Pain Location: L hip with movement Pain Descriptors / Indicators: Discomfort Pain Intervention(s): Monitored during  session, Repositioned    Home Living Family/patient expects to be discharged to:: Private residence Living Arrangements: Spouse/significant other Available Help at Discharge: Family;Available 24 hours/day Type of Home: House Home Access: Stairs to enter   [employer] of Steps: 5   Home Layout: One level Home Equipment: Rollator (4 wheels);Wheelchair - power;Electric scooter Additional Comments: pt reports HH is getting him a seat for his shower    Prior Function Prior Level of Function : Needs assist             Mobility Comments: has been using Rollator since fall ADLs Comments: reports requiring increased assistance from spouse for LB ADLs     Hand Dominance        Extremity/Trunk Assessment   Upper Extremity Assessment Upper Extremity Assessment: Defer to OT evaluation    Lower Extremity Assessment Lower Extremity Assessment: Generalized weakness (no instances of buckling, poor muscular endurance, reports sensation intact)    Cervical / Trunk Assessment Cervical / Trunk Assessment: Kyphotic  Communication   Communication: HOH  Cognition Arousal/Alertness: Awake/alert Behavior During Therapy: WFL for tasks assessed/performed Overall Cognitive Status: Within Functional Limits for tasks assessed Area of Impairment: Following commands                       Following Commands: Follows one step commands with increased time       General Comments: possibly due to [facility] vs cognition        General Comments General comments (skin integrity, edema, etc.): SpO2 > 91% on RA with gait, pt reports SOB and tried 2L O2 without change in SpO2 or sx. HR to 134bpm    Exercises     Assessment/Plan    PT Assessment Patient needs continued PT services  PT Problem List Decreased strength;Decreased range of motion;Decreased activity tolerance;Decreased balance;Decreased mobility;Decreased safety awareness       PT Treatment Interventions DME  instruction;Gait training;Stair training;Functional mobility training;Therapeutic activities;Balance training;Therapeutic exercise;Patient/family education    PT Goals (Current goals can be found in the Care Plan section)  Acute Rehab PT Goals Patient Stated Goal: return home PT Goal Formulation: With patient Time For Goal Achievement: 04/05/22 Potential to Achieve Goals: Good    Frequency Min 3X/week        AM-PAC PT "6 Clicks" Mobility  Outcome Measure Help needed turning from your back to your side while in a flat bed without using bedrails?: A Little Help needed moving from lying on your back to sitting on the side of a flat bed without using bedrails?: A Little Help needed moving to and from a bed to a chair (including a wheelchair)?: A Little Help needed standing up from a chair using your arms (e.g., wheelchair or bedside chair)?: A Little Help needed to walk in hospital room?: A Little Help needed climbing 3-5 steps with a railing? : A Little 6 Click Score: 18    End of Session Equipment Utilized During Treatment: Gait belt;Oxygen Activity Tolerance: Patient tolerated treatment well Patient left: in bed;with call bell/phone within reach (echo arrived at end of session) Nurse Communication: Mobility status PT Visit Diagnosis: Unsteadiness on feet (R26.81);Muscle weakness (generalized) (M62.81)    Time: 0258-5277 PT Time Calculation (min) (ACUTE ONLY): 15 min   Charges:   PT Evaluation $PT Eval Low Complexity: 1  Low          [place] Carbo, PT, DPT   Acute Rehabilitation Department Pager #: [phone]  Sandra Cockayne 03/22/2022, 12:48 PM

## 2022-03-22 NOTE — Hospital Course (Addendum)
Randy Bowen is a 81 y.o. male with a history of rheumatoid arthritis, erythrodermic psoriasis, ankylosing spondylitis, chronic systemic corticosteroid use, and severe COPD who presented with SOB and generalized malaise and was admitted for COPD exacerbation. Hospital course is outline by problem below.   ED Course Patient presented with worsening SOB and persistent L hip and rib pain in setting of recent fall. Patient was tachycardic and afebrile on arrival and was satting 98% on his baseline 2L of supplemental O2. ED labs notable for CBC with mild leukocytosis of 13.9 and Hgb 12.9, troponins 47 -> 21, BNP 25.4, negative flu and COVID, EKG sinus tachycardia. CXR wnl, hip XR and CT head negative. He received IV solumedrol x1 and albuterol.   COPD exacerbation Hx of severe COPD   Transitioned from IV solumedrol to prednisone 40 mg and given duonebs and azithromycin. Elevated d-dimer to 12.7, CTa negative for PE but showed left lower lobe airspace disease concerning for pneumonia. Blood cultures with no growth at 12 hours. Patient remained afebrile, low clinical suspicion for pneumonia. He was discharged with instructions to complete a 5 day course of prednisone 40 mg, then switch to his home regimen of prednisone 10 mg daily, and 3-day course of azithromycin.   Sinus tachycardia Intermittently tachycardic with some response to IV fluids. Placed on continuous telemetry, remained in sinus rhythm during his stay. TSH wnl, Echo with LV EF of 55-60%, mildly elevated pulmonary artery systolic pressure, mild to moderate tricuspid regurgitation, and borderline dilatation of the ascending aorta at 36 mm.   Erythrodermic psoriasis  Patient recently transferred Rheumatology care from [facility] to [place]. Was taking Skyrizi but it caused painful joints so he restarted [name]. Saw new Rheumatologist Dr.  Asher Muir once in February 2023. Instructed to follow-up with outpatient Rheum regarding  psoriasis medication.

## 2022-03-22 NOTE — Discharge Instructions (Addendum)
You were hospitalized for worsening of your COPD. Please take prednisone 40 mg daily through Mar 26, 2022, then resume your normal prednisone 10 mg daily. Please take the azithromycin 500 mg daily for a total of 3 days, last dose on Mar 24, 2022. Continue your daily COPD inhalers.   For your erythrodermic psoriasis, please contact your Rheumatologist Dr. Asher Muir at the [facility] to make a hospital follow-up appointment so she can prescribe you the appropriate medications.

## 2022-03-22 NOTE — Progress Notes (Signed)
  Echocardiogram 2D Echocardiogram has been performed.  Randy Bowen 03/22/2022, 10:33 AM

## 2022-03-22 NOTE — Evaluation (Signed)
Occupational Therapy Evaluation Patient Details Name: Randy Bowen MRN: [id number] DOB: [birth year] Today's Date: 03/22/2022   History of Present Illness Pt is a 81 y/o M presenting to ED on 5/21 from home with SOB after falling last week, L rib and hip pain, reports blurry vision with increased activity. PMH includes RA, psoriatic arthritis, COPD.   Clinical Impression   Pt typically independent with ADLs/mobility at baseline, lives with wife and daughter lives across the street. Pt reports needing increased assistance with LB ADLs and using Rollator for mobility since fall. Pt currently requiring supervision-mod A for ADLs, supervision for bed mobility, and min guard for transfers. VSS on RA during session, HR 120's with ambulation to bathroom using RW. Pt presenting with impairments listed below, will follow acutely. Recommend HHOT at d/c.     Recommendations for follow up therapy are one component of a multi-disciplinary discharge planning process, led by the attending physician.  Recommendations may be updated based on patient status, additional functional criteria and insurance authorization.   Follow Up Recommendations  Home health OT (resume HHOT)    Assistance Recommended at Discharge Set up Supervision/Assistance  Patient can return home with the following A little help with walking and/or transfers;A little help with bathing/dressing/bathroom;Assistance with cooking/housework;Help with stairs or ramp for entrance;Assist for transportation    Functional Status Assessment  Patient has had a recent decline in their functional status and demonstrates the ability to make significant improvements in function in a reasonable and predictable amount of time.  Equipment Recommendations  Tub/shower seat    Recommendations for Other Services       Precautions / Restrictions        Mobility Bed Mobility Overal bed mobility: Needs Assistance Bed Mobility: Supine to Sit      Supine to sit: Supervision          Transfers Overall transfer level: Needs assistance Equipment used: Rolling walker (2 wheels) Transfers: Sit to/from Stand Sit to Stand: Min guard                  Balance Overall balance assessment: Needs assistance Sitting-balance support: Bilateral upper extremity supported, Feet supported Sitting balance-Leahy Scale: Good Sitting balance - Comments: can reach down towards feet to pull up pants   Standing balance support: During functional activity, Reliant on assistive device for balance Standing balance-Leahy Scale: Poor Standing balance comment: reliant on RW support                           ADL either performed or assessed with clinical judgement   ADL Overall ADL's : Needs assistance/impaired Eating/Feeding: Modified independent;Sitting   Grooming: Min guard;Standing   Upper Body Bathing: Supervision/ safety;Sitting   Lower Body Bathing: Moderate assistance;Sitting/lateral leans   Upper Body Dressing : Supervision/safety;Sitting   Lower Body Dressing: Moderate assistance;Sitting/lateral leans   Toilet Transfer: Nature conservation officer;Ambulation;Rolling walker (2 wheels) Toilet Transfer Details (indicate cue type and reason): + use of grab bar Toileting- Clothing Manipulation and Hygiene: Supervision/safety;Sit to/from stand Toileting - Clothing Manipulation Details (indicate cue type and reason): to don/doff pants     Functional mobility during ADLs: Min guard;Rolling walker (2 wheels);Cueing for safety       Vision Baseline Vision/History: 1 Wears glasses Vision Assessment?: No apparent visual deficits Additional Comments: reports blurry vision with increased activity in the past, although not noted this session. Pt reports he is getting new glasses from his eye dr  Perception     Praxis      Pertinent Vitals/Pain Pain Assessment Pain Assessment: Faces Pain Score: 2  Faces Pain Scale:  Hurts a little bit Pain Location: L hip with movement Pain Descriptors / Indicators: Discomfort Pain Intervention(s): Limited activity within patient's tolerance, Monitored during session     Hand Dominance     Extremity/Trunk Assessment Upper Extremity Assessment Upper Extremity Assessment: Generalized weakness   Lower Extremity Assessment Lower Extremity Assessment: Defer to PT evaluation   Cervical / Trunk Assessment Cervical / Trunk Assessment: Kyphotic   Communication Communication Communication: HOH   Cognition Arousal/Alertness: Awake/alert Behavior During Therapy: WFL for tasks assessed/performed Overall Cognitive Status: Within Functional Limits for tasks assessed Area of Impairment: Following commands                       Following Commands: Follows one step commands with increased time       General Comments: possibly due to [facility] vs cognition     General Comments  SpO2 >89% on RA during session, HR 120's with ambulation to bathroom    Exercises     Shoulder Instructions      Home Living Family/patient expects to be discharged to:: Private residence Living Arrangements: Spouse/significant other Available Help at Discharge: Family;Available 24 hours/day Type of Home: House Home Access: Stairs to enter [employer] of Steps: 5   Home Layout: One level     Bathroom Shower/Tub: Teacher, early years/pre: Standard Bathroom Accessibility: No   Home Equipment: Rollator (4 wheels);Wheelchair - power;Electric scooter   Additional Comments: pt reports HH is getting him a seat for his shower      Prior Functioning/Environment Prior Level of Function : Needs assist             Mobility Comments: has been using Rollator since fall ADLs Comments: reports requiring increased assistance from spouse for LB ADLs        OT Problem List: Decreased range of motion;Decreased strength;Decreased activity tolerance;Impaired  balance (sitting and/or standing);Decreased safety awareness;Decreased knowledge of use of DME or AE;Cardiopulmonary status limiting activity      OT Treatment/Interventions: Therapeutic exercise;Self-care/ADL training;Energy conservation;DME and/or AE instruction;Therapeutic activities;Patient/family education;Balance training    OT Goals(Current goals can be found in the care plan section) Acute Rehab OT Goals Patient Stated Goal: none stated OT Goal Formulation: With patient Time For Goal Achievement: 04/05/22 Potential to Achieve Goals: Good ADL Goals Pt Will Perform Lower Body Dressing: with min assist;sitting/lateral leans;sit to/from stand Pt Will Transfer to Toilet: with supervision;regular height toilet;ambulating Additional ADL Goal #1: Pt will be able to complete 5 minute standing task in order to improve activity tolerance for ADLs Additional ADL Goal #2: Pt will be able to verbalize 3 energy conservation strategies  OT Frequency: Min 2X/week    Co-evaluation              AM-PAC OT "6 Clicks" Daily Activity     Outcome Measure Help from another person eating meals?: None Help from another person taking care of personal grooming?: A Little Help from another person toileting, which includes using toliet, bedpan, or urinal?: None Help from another person bathing (including washing, rinsing, drying)?: A Little Help from another person to put on and taking off regular upper body clothing?: A Little Help from another person to put on and taking off regular lower body clothing?: A Lot 6 Click Score: 19   End of Session Equipment Utilized During  Treatment: Gait belt;Rolling walker (2 wheels) Nurse Communication: Mobility status  Activity Tolerance: Patient tolerated treatment well Patient left: Other (comment) (up in room, handoff to PT)  OT Visit Diagnosis: Unsteadiness on feet (R26.81);Repeated falls (R29.6);History of falling (Z91.81);Muscle weakness (generalized)  (M62.81);Other abnormalities of gait and mobility (R26.89)                Time: 4862-8241 OT Time Calculation (min): 18 min Charges:  OT General Charges $OT Visit: 1 Visit OT Evaluation $OT Eval Low Complexity: 1 Low  Lynnda Child, OTD, OTR/L Acute Rehab [phone]) [phone] - [place] 03/22/2022, 9:47 AM

## 2022-03-22 NOTE — ED Notes (Signed)
Called 3E and requested purple man handoff be initiated for report.

## 2022-03-26 LAB — CULTURE, BLOOD (ROUTINE X 2)
Culture: NO GROWTH
Culture: NO GROWTH
Special Requests: ADEQUATE
Special Requests: ADEQUATE

## 2022-04-12 ENCOUNTER — Ambulatory Visit: Payer: No Typology Code available for payment source | Admitting: Internal Medicine

## 2022-04-15 ENCOUNTER — Ambulatory Visit (INDEPENDENT_AMBULATORY_CARE_PROVIDER_SITE_OTHER): Payer: No Typology Code available for payment source | Admitting: Internal Medicine

## 2022-04-15 ENCOUNTER — Encounter: Payer: Self-pay | Admitting: Internal Medicine

## 2022-04-15 VITALS — BP 96/70 | HR 103 | Temp 98.4°F | Ht 73.0 in | Wt 124.8 lb

## 2022-04-15 DIAGNOSIS — J9611 Chronic respiratory failure with hypoxia: Secondary | ICD-10-CM

## 2022-04-15 DIAGNOSIS — J449 Chronic obstructive pulmonary disease, unspecified: Secondary | ICD-10-CM

## 2022-04-15 MED ORDER — FORMOTEROL FUMARATE 20 MCG/2ML IN NEBU: 20.0000 ug | INHALATION_SOLUTION | Freq: Two times a day (BID) | RESPIRATORY_TRACT | 11 refills | Status: DC

## 2022-04-15 MED ORDER — REVEFENACIN 175 MCG/3ML IN SOLN: 175.0000 ug | Freq: Every day | RESPIRATORY_TRACT | 11 refills | Status: DC

## 2022-04-15 MED ORDER — BUDESONIDE 0.25 MG/2ML IN SUSP: 0.2500 mg | Freq: Two times a day (BID) | RESPIRATORY_TRACT | 12 refills | Status: DC

## 2022-04-15 NOTE — Patient Instructions (Signed)
Please schedule follow up scheduled with myself in 3 months.  If my schedule is not open yet, we will contact you with a reminder closer to that time. Please call [phone] if you haven't heard from [place] a month before.   I am referring you to pulmonary rehab at [facility] - they will call you to schedule this and it might be a few weeks.   I have sent nebulized forms of your current inhalers to the [place] - hopefully they will keep them covered and they might be better than the wixela and incruse.   Follow up closely with your rheumatologist and dermatologist.   Call me if any issues or concerns about your breathing sooner. Try not to wait until it becomes so bad you have to go to the hospital. I will see you for a sick visit sooner if needed.

## 2022-04-15 NOTE — Progress Notes (Addendum)
Randy Bowen    [id number]    1941/06/08  Primary Care Physician:Salmon, Randy Merritts, MD Date of Appointment: 04/15/2022 Established Patient Visit  Chief complaint:   Chief Complaint  Patient presents with   Follow-up   Hospitalization Follow-up    COPD exacerbation, weakness, flare of skin condition.  Still weak.  Was feeling better until yesterday.  Having PT without walker.  Still sob talking, walking, eating.  Improved from hospital stay.     HPI: Randy Bowen is a 81 y.o. man with severe COPD FEV1 32% of predicted.   Interval Updates: Here for follow up. Recently hospitalized for copd exacerbation. Also had a flare of his erythrodermic psoriasis.  Had another ED visit for COPD requiring steroids (2 since March 2023)thinks the flare of psoriasis was secondary to trial of new medication biologic therapy - from rheumatologist at the [facility].   He has elevated RF, CCP and saw Dr. Sherril Bowen the rheumatologist. Due to start a new medication for autoimmune disease. Currently on xeljanz.   He has had trouble navigating the [place] system and has not had good follow up for dermatology regarding his psoriasis.   Using albuterol nebulizer about twice a day. Wife does not feel that he is getting much of the medicine into his lungs from wixela and incruse due to poor inspiratory effort.   PFT results reviewed with them today.  He is interested in pulmonary rehab and was supposed to start this back when they lived in [place].   I have reviewed the patient's family social and past medical history and updated as appropriate.   Past Medical History:  Diagnosis Date   Psoriatic arthritis ([place])    Rheumatoid arthritis ([place])     No past surgical history on file.  Family History  Problem Relation Age of Onset   Emphysema Father     Social History   Occupational History   Not on file  Tobacco Use   Smoking status: Former    Packs/day: 1.00    Years: 30.00    Total pack  years: 30.00    Types: Cigarettes    Quit date: 2003    Years since quitting: 20.4   Smokeless tobacco: Never  Substance and Sexual Activity   Alcohol use: Not on file   Drug use: Not on file   Sexual activity: Not on file     Physical Exam: Blood pressure 96/70, pulse (!) 103, temperature 98.4 F (36.9 C), temperature source Oral, height '6\' 1"'$  (1.854 m), weight 124 lb 12.8 oz (56.6 kg), SpO2 97 %.  Gen:      No acute distress, chronically ill, thin, in wheelchair ENT:  no nasal polyps, mucus membranes moist Lungs:    diminished, no wheezes or crackles CV:         Regular rate and rhythm; no murmurs, rubs, or gallops.  No pedal edema MSK: multiple psoriatric patches on upper extremities  Data Reviewed: Imaging: I have personally reviewed the CT angio May 2023 - severe centrilobular and paraseptal emphysema  PFTs:     Latest Ref Rng & Units 02/08/2022    2:58 PM  PFT Results  FVC-Pre L 2.77   FVC-Predicted Pre % 59   FVC-Post L 2.91   FVC-Predicted Post % 62   Pre FEV1/FVC % % 39   Post FEV1/FCV % % 40   FEV1-Pre L 1.07   FEV1-Predicted Pre % 32   FEV1-Post L 1.16  DLCO uncorrected ml/min/mmHg 9.29   DLCO UNC% % 34   DLCO corrected ml/min/mmHg 10.94   DLCO COR %Predicted % 40   DLVA Predicted % 59   TLC L 9.40   TLC % Predicted % 122   RV % Predicted % 211    I have personally reviewed the patient's PFTs and FEV1 32% of predicted.   Labs: Elevated RF and CCP.   Immunization status: Immunization History  Administered Date(s) Administered   Pneumococcal Conjugate-13 11/04/2014, 11/27/2014   Pneumococcal Polysaccharide-23 08/01/2008, 08/02/2011   Pneumococcal-Unspecified 11/26/2002, 08/01/2009, 11/01/2009    External Records Personally Reviewed:   Assessment:  Gold Stage IV COPD FEV1 32% of predicted Erythrodermic Psoriasis Chronic respiratory failure Rheumatoid Arthritis   Plan/Recommendations: Refer to pulmonary rehab - he is highly motivated.   Will see if we can get nebulized LAMA-LABA-ICS from the [place]. He is unable to tolerate DPI due to such poor lung function and has already failed HFA inhalers.  The alternatives at the [place] are mometasone, which are all inhaled. Needs PA for perforomist Inhalation.  Encouraged closer follow up with rheum and derm.   Return to Care: Return in about 3 months (around 07/16/2022).   Lenice Llamas, MD Pulmonary and [place]

## 2022-04-15 NOTE — Addendum Note (Signed)
Addended by: Vanessa Barbara on: 04/15/2022 01:59 PM   Modules accepted: Orders

## 2022-05-03 ENCOUNTER — Encounter (HOSPITAL_COMMUNITY): Payer: Self-pay

## 2022-05-18 ENCOUNTER — Telehealth: Payer: Self-pay

## 2022-05-18 NOTE — Telephone Encounter (Signed)
Patient Advocate Encounter   Received notification from [facility] that prior authorization for PERFOROMIST and PULMICORT is required/requested.  PA forms submitted to [facility] via fax [phone]  Clista Bernhardt, [place] Patient Advocate Specialist [place] Patient Advocate Team Phone: [phone]   Fax: [phone]

## 2022-05-19 ENCOUNTER — Telehealth (HOSPITAL_COMMUNITY): Payer: Self-pay | Admitting: *Deleted

## 2022-05-19 NOTE — Telephone Encounter (Signed)
Pt wife, nancy, who is listed on the dpr, left message on departmental voicemail for pulmonary rehab after receiving letter to contact. Called and left message for pt wife - nancy. Meanwhile contacted the care in the community coordinator - angel regarding the authorization dates. Would not be able to start and or get very few completed prior to the end of the dates. Pt will need new [facility] authorization for pulmonary rehab placed. Cherre Huger, BSN Cardiac and Training and development officer

## 2022-05-19 NOTE — Telephone Encounter (Signed)
Received message left on departmental voicemail returning my call from message I had left earlier.  Called and left message requesting pt wife, nancy return my call. Cherre Huger, BSN Cardiac and Training and development officer

## 2022-05-20 NOTE — Telephone Encounter (Signed)
He has failed the formulary alternatives stiolto and advair. Needs yupelri for this reason. Please send reason with PA.

## 2022-05-24 ENCOUNTER — Other Ambulatory Visit (HOSPITAL_COMMUNITY): Payer: Self-pay

## 2022-05-24 NOTE — Telephone Encounter (Signed)
Patient Advocate Encounter   Began process of [employer] prior authorization for Yupelri via CoverMyMeds. Please use telephone encounter from 05/20/22 as confirmation that Advair and Stiolto have both provided ineffective therapy.  Key [id number] Submitted: 05/24/2022 Status is pending  Clista Bernhardt, [place] Patient Advocate Specialist [place] Patient Advocate Team Phone: [phone]   Fax: [phone]

## 2022-07-19 ENCOUNTER — Telehealth (HOSPITAL_COMMUNITY): Payer: Self-pay

## 2022-07-19 NOTE — Telephone Encounter (Signed)
No response from pt regarding PR.   Closed referral. 

## 2022-07-26 ENCOUNTER — Ambulatory Visit: Payer: No Typology Code available for payment source | Admitting: Internal Medicine

## 2022-08-06 ENCOUNTER — Other Ambulatory Visit (HOSPITAL_COMMUNITY): Payer: Self-pay | Admitting: Nurse Practitioner

## 2022-08-06 DIAGNOSIS — R918 Other nonspecific abnormal finding of lung field: Secondary | ICD-10-CM

## 2022-08-23 ENCOUNTER — Encounter (HOSPITAL_COMMUNITY): Payer: Self-pay

## 2022-08-23 ENCOUNTER — Other Ambulatory Visit: Payer: Self-pay

## 2022-08-23 ENCOUNTER — Ambulatory Visit (HOSPITAL_COMMUNITY)
Admission: RE | Admit: 2022-08-23 | Discharge: 2022-08-23 | Disposition: A | Discharge status: Home / Self Care | Payer: No Typology Code available for payment source | Source: Ambulatory Visit | Attending: Nurse Practitioner | Admitting: Nurse Practitioner

## 2022-08-23 ENCOUNTER — Emergency Department (HOSPITAL_COMMUNITY): Payer: No Typology Code available for payment source

## 2022-08-23 ENCOUNTER — Emergency Department (HOSPITAL_COMMUNITY)
Admission: EM | Admit: 2022-08-23 | Discharge: 2022-08-23 | Disposition: A | Discharge status: Home / Self Care | Payer: No Typology Code available for payment source | Attending: Emergency Medicine | Admitting: Emergency Medicine

## 2022-08-23 DIAGNOSIS — J449 Chronic obstructive pulmonary disease, unspecified: Secondary | ICD-10-CM | POA: Diagnosis not present

## 2022-08-23 DIAGNOSIS — D72829 Elevated white blood cell count, unspecified: Secondary | ICD-10-CM | POA: Insufficient documentation

## 2022-08-23 DIAGNOSIS — R112 Nausea with vomiting, unspecified: Secondary | ICD-10-CM | POA: Diagnosis not present

## 2022-08-23 DIAGNOSIS — Z7951 Long term (current) use of inhaled steroids: Secondary | ICD-10-CM | POA: Insufficient documentation

## 2022-08-23 DIAGNOSIS — R918 Other nonspecific abnormal finding of lung field: Secondary | ICD-10-CM | POA: Diagnosis present

## 2022-08-23 DIAGNOSIS — R1084 Generalized abdominal pain: Secondary | ICD-10-CM

## 2022-08-23 DIAGNOSIS — K5900 Constipation, unspecified: Secondary | ICD-10-CM | POA: Diagnosis present

## 2022-08-23 DIAGNOSIS — Z8546 Personal history of malignant neoplasm of prostate: Secondary | ICD-10-CM | POA: Insufficient documentation

## 2022-08-23 HISTORY — DX: Psoriasis, unspecified: L40.9

## 2022-08-23 HISTORY — DX: Diverticulosis of intestine, part unspecified, without perforation or abscess without bleeding: K57.90

## 2022-08-23 HISTORY — DX: Malignant (primary) neoplasm, unspecified: C80.1

## 2022-08-23 LAB — URINALYSIS, ROUTINE W REFLEX MICROSCOPIC
Bilirubin Urine: NEGATIVE
Glucose, UA: NEGATIVE mg/dL
Hgb urine dipstick: NEGATIVE
Ketones, ur: NEGATIVE mg/dL
Leukocytes,Ua: NEGATIVE
Nitrite: NEGATIVE
Protein, ur: NEGATIVE mg/dL
Specific Gravity, Urine: >1.046 — ABNORMAL HIGH (ref 1.005–1.030)
pH: 7 (ref 5.0–8.0)

## 2022-08-23 LAB — COMPREHENSIVE METABOLIC PANEL
ALT: 19 U/L (ref 0–44)
AST: 22 U/L (ref 15–41)
Albumin: 3.1 g/dL — ABNORMAL LOW (ref 3.5–5.0)
Alkaline Phosphatase: 52 U/L (ref 38–126)
Anion gap: 9 (ref 5–15)
BUN: 10 mg/dL (ref 8–23)
CO2: 26 mmol/L (ref 22–32)
Calcium: 8.9 mg/dL (ref 8.9–10.3)
Chloride: 103 mmol/L (ref 98–111)
Creatinine, Ser: 0.66 mg/dL (ref 0.61–1.24)
GFR, Estimated: >60 mL/min (ref >60)
Glucose, Bld: 104 mg/dL — ABNORMAL HIGH (ref 70–99)
Potassium: 3.5 mmol/L (ref 3.5–5.1)
Sodium: 138 mmol/L (ref 135–145)
Total Bilirubin: 0.7 mg/dL (ref 0.3–1.2)
Total Protein: 6.7 g/dL (ref 6.5–8.1)

## 2022-08-23 LAB — CBC
HCT: 44.3 % (ref 39.0–52.0)
Hemoglobin: 13.9 g/dL (ref 13.0–17.0)
MCH: 27.6 pg (ref 26.0–34.0)
MCHC: 31.4 g/dL (ref 30.0–36.0)
MCV: 88.1 fL (ref 80.0–100.0)
Platelets: 506 10*3/uL — ABNORMAL HIGH (ref 150–400)
RBC: 5.03 MIL/uL (ref 4.22–5.81)
RDW: 14.2 % (ref 11.5–15.5)
WBC: 13.2 10*3/uL — ABNORMAL HIGH (ref 4.0–10.5)
nRBC: 0 % (ref 0.0–0.2)

## 2022-08-23 LAB — LIPASE, BLOOD: Lipase: 49 U/L (ref 11–51)

## 2022-08-23 MED ORDER — DOCUSATE SODIUM 283 MG RE ENEM: 1.0000 | ENEMA | Freq: Every day | RECTAL | 0 refills | Status: DC

## 2022-08-23 MED ORDER — POLYETHYLENE GLYCOL 3350 17 G PO PACK: 17.0000 g | PACK | Freq: Every day | ORAL | 0 refills | Status: AC

## 2022-08-23 MED ORDER — ONDANSETRON HCL 4 MG/2ML IJ SOLN
4.0000 mg | Freq: Once | INTRAMUSCULAR | Status: AC
Start: 2022-08-23 — End: 2022-08-23
  Administered 2022-08-23: 4 mg via INTRAVENOUS
  Filled 2022-08-23: qty 2

## 2022-08-23 MED ORDER — IOHEXOL 300 MG/ML  SOLN
100.0000 mL | Freq: Once | INTRAMUSCULAR | Status: AC | PRN
  Administered 2022-08-23: 80 mL via INTRAVENOUS

## 2022-08-23 MED ORDER — HYDROCODONE-ACETAMINOPHEN 5-325 MG PO TABS
1.0000 | ORAL_TABLET | Freq: Once | ORAL | Status: AC
  Administered 2022-08-23: 1 via ORAL
  Filled 2022-08-23: qty 1

## 2022-08-23 MED ORDER — SODIUM CHLORIDE 0.9 % IV BOLUS
500.0000 mL | Freq: Once | INTRAVENOUS | Status: AC
  Administered 2022-08-23: 500 mL via INTRAVENOUS

## 2022-08-23 MED ORDER — FENTANYL CITRATE PF 50 MCG/ML IJ SOSY
50.0000 ug | PREFILLED_SYRINGE | Freq: Once | INTRAMUSCULAR | Status: AC
  Administered 2022-08-23: 50 ug via INTRAVENOUS
  Filled 2022-08-23: qty 1

## 2022-08-23 NOTE — ED Provider Triage Note (Signed)
Emergency Medicine Provider Triage Evaluation Note  Randy Bowen , a 81 y.o. male  was evaluated in triage.  Pt complains of constipation of the last 6 days, now with the inability to pass flatulence for last 24 hours.  Reporting exquisite pain, worst of his life in the right lower quadrant area.  Is noticed a lump near his hip/groin area that worsens with the abdominal pain.  New onset of nausea and vomiting.  Has had possible fevers, however says the temperature they measured at home is unreliable.  Denies chills, changes in urinary habits, chest pain, or shortness of breath.  Review of Systems  Positive:  Negative: See above  Physical Exam  BP 112/82 (BP Location: Left Arm)   Pulse (!) 115   Temp 98.1 F (36.7 C) (Oral)   Resp 16   Ht '6\' 1"'$  (1.854 m)   Wt 54.4 kg   SpO2 98%   BMI 15.83 kg/m  Gen:   Awake, ill-appearing, tearful, uncomfortable appearing Resp:  Normal effort  MSK:   Moves extremities without difficulty  Other:  Palpable mass near the right inguinal canal.  When palpated, audible and palpable gas bubbles appreciated with significant tenderness.  Patient with generalized abdominal tenderness.  Attempted reduction in triage, however unsuccessful, patient unable to tolerate due to experienced tenderness  Medical Decision Making  Medically screening exam initiated at 1:21 PM.  Appropriate orders placed.  Loi P Lloyd was informed that the remainder of the evaluation will be completed by another provider, this initial triage assessment does not replace that evaluation, and the importance of remaining in the ED until their evaluation is complete.  Discussed patient case with charge nurse, IV being placed and plan to proceed with stat CT abdomen and pelvis to assess for bowel obstruction, incarceration, or perforation   Prince Rome, PA-C 17/51/02 1400

## 2022-08-23 NOTE — Discharge Instructions (Addendum)
Your evaluated today in the ED for abdominal pain and constipation.    Imaging showed confirmation of constipation with some gas located throughout the intestines.  A fecal disimpaction and an enema were performed today, we are able to remove a large amount of stool.  It is recommended to start MiraLAX tomorrow and continue to utilize this daily for the next 1 to 2 weeks.  Recommend following up with your PCP within the next 3 to 5 days for reevaluation and continued medical management.  Imaging also showed an abdominal aortic aneurysm without evidence of complication today.  Due to its size and your medical history, recommend following up closely with a vascular specialist.  Dr. Virl Cagey contact information has been provided for you.  Please call first thing tomorrow morning to schedule a follow-up appointment within the next 1 to 2 weeks.  Return to the ED for any new or worsening symptoms as discussed.

## 2022-08-23 NOTE — ED Provider Notes (Signed)
[facility] DEPT Provider Note   CSN: [id number] Arrival date & time: 08/23/22  1213     History  Chief Complaint  Patient presents with   Abdominal Pain   Emesis   Constipation    Mickeal P Mcknight is a 81 y.o. male presenting to the ED today with worsening constipation.  Has not had a bowel movement in 6 days, now no longer able to pass flatulence since this morning.  Describes pain as severe, 10/10, with "a lot of pressure" in the abdomen.  Has had nausea with 1-2 episodes of vomiting, described as clear.  Tried using a home enema kit once this weekend without relief, also uses Metamucil and probiotics daily.  States "I do not have the strength in my stomach to push down very well anymore".  Denies fever, chills, changes in urinary habits, shortness of breath, chest pain, lower extremity weakness, dizziness, or syncope.  Hx of erythrodermic psoriasis, rheumatoid arthritis, ankylosing spondylitis, severe COPD, long-term use of systemic steroids, SNHL bilateral, prostate cancer, diverticulosis  The history is provided by the patient and medical records.  Abdominal Pain Associated symptoms: constipation and vomiting   Emesis Associated symptoms: abdominal pain   Constipation Associated symptoms: abdominal pain and vomiting       Home Medications Prior to Admission medications   Medication Sig Start Date End Date Taking? Authorizing Provider  docusate sodium (ENEMEEZ) 283 MG enema Place 1 enema (283 mg total) rectally daily. 08/23/22  Yes Prince Rome, PA-C  polyethylene glycol (MIRALAX / GLYCOLAX) 17 g packet Take 17 g by mouth daily for 14 days. 08/23/22 09/06/22 Yes Prince Rome, PA-C  albuterol (PROVENTIL) (5 MG/ML) 0.5% nebulizer solution Take 2.5 mg by nebulization every 6 (six) hours as needed for wheezing or shortness of breath.    [provider]  albuterol (VENTOLIN HFA) 108 (90 Base) MCG/ACT inhaler Inhale into the  lungs.    [provider]  budesonide (PULMICORT) 0.25 MG/2ML nebulizer solution Take 2 mLs (0.25 mg total) by nebulization in the morning and at bedtime. 04/15/22   Spero Geralds, MD  Calcium Carb-Cholecalciferol 500-10 MG-MCG TABS Take 2 tablets by mouth daily. 09/19/12   [provider]  Cholecalciferol 25 MCG (1000 UT) tablet Take 2 tablets by mouth daily. 04/14/20   [provider]  fluticasone-salmeterol (WIXELA INHUB) 500-50 MCG/ACT AEPB Inhale 1 puff into the lungs in the morning and at bedtime.    [provider]  formoterol (PERFOROMIST) 20 MCG/2ML nebulizer solution Take 2 mLs (20 mcg total) by nebulization 2 (two) times daily. 04/15/22   Spero Geralds, MD  lidocaine (LIDODERM) 5 % APPLY 1 PATCH TO SKIN ONCE DAILY (APPLY FOR 12 HOURS, THEN REMOVE FOR 12 HOURS) 12/03/21   [provider]  revefenacin (YUPELRI) 175 MCG/3ML nebulizer solution Take 3 mLs (175 mcg total) by nebulization daily. 04/15/22   Spero Geralds, MD  Tiotropium Bromide Monohydrate (SPIRIVA RESPIMAT) 2.5 MCG/ACT AERS Inhale into the lungs. Patient not taking: Reported on 04/15/2022    [provider]      Allergies    Skyrizi [risankizumab]    Review of Systems   Review of Systems  Gastrointestinal:  Positive for abdominal pain, constipation and vomiting.    Physical Exam Updated Vital Signs BP 122/70   Pulse 100   Temp 97.9 F (36.6 C) (Oral)   Resp 17   Ht 6' 1"  (1.854 m)   Wt 54.4 kg   SpO2  96%   BMI 15.83 kg/m  Physical Exam Vitals and nursing note reviewed. Exam conducted with a chaperone present.  Constitutional:      General: He is not in acute distress.    Appearance: He is well-developed. He is not ill-appearing or toxic-appearing.  HENT:     Head: Normocephalic and atraumatic.     Mouth/Throat:     Pharynx: Oropharynx is clear.  Eyes:     General: No scleral icterus.    Conjunctiva/sclera: Conjunctivae normal.  Cardiovascular:      Rate and Rhythm: Normal rate and regular rhythm.     Heart sounds: No murmur heard. Pulmonary:     Effort: Pulmonary effort is normal. No respiratory distress.     Breath sounds: Normal breath sounds.  Abdominal:     General: There is distension (Mild, gas bubble movements appreciated when palpated).     Palpations: Abdomen is soft.     Tenderness: There is generalized abdominal tenderness. There is no guarding or rebound. Negative signs include Murphy's sign and McBurney's sign.  Genitourinary:    Rectum: No tenderness, anal fissure or external hemorrhoid. Normal anal tone.  Musculoskeletal:        General: No swelling.     Cervical back: Neck supple.  Skin:    General: Skin is warm and dry.     Capillary Refill: Capillary refill takes less than 2 seconds.     Coloration: Skin is not cyanotic, jaundiced or pale.  Neurological:     Mental Status: He is alert and oriented to person, place, and time.  Psychiatric:        Mood and Affect: Mood normal.     ED Results / Procedures / Treatments   Labs (all labs ordered are listed, but only abnormal results are displayed) Labs Reviewed  COMPREHENSIVE METABOLIC PANEL - Abnormal; Notable for the following components:      Result Value   Glucose, Bld 104 (*)    Albumin 3.1 (*)    All other components within normal limits  CBC - Abnormal; Notable for the following components:   WBC 13.2 (*)    Platelets 506 (*)    All other components within normal limits  URINALYSIS, ROUTINE W REFLEX MICROSCOPIC - Abnormal; Notable for the following components:   Specific Gravity, Urine >1.046 (*)    All other components within normal limits  LIPASE, BLOOD    EKG None  Radiology CT Abdomen Pelvis W Contrast  Result Date: 08/23/2022 CLINICAL DATA:  Constipation for 6 days, nausea, vomiting, right lower quadrant pain EXAM: CT ABDOMEN AND PELVIS WITH CONTRAST TECHNIQUE: Multidetector CT imaging of the abdomen and pelvis was performed using the  standard protocol following bolus administration of intravenous contrast. RADIATION DOSE REDUCTION: This exam was performed according to the departmental dose-optimization program which includes automated exposure control, adjustment of the mA and/or kV according to patient size and/or use of iterative reconstruction technique. CONTRAST:  85m OMNIPAQUE IOHEXOL 300 MG/ML  SOLN COMPARISON:  None Available. FINDINGS: Lower chest: Lungs are assessed on the same day CT chest. Hepatobiliary: Are numerous calcifications throughout the liver likely reflecting sequela of prior granulomatous disease. There are no suspicious liver lesions. The gallbladder is unremarkable. There is no biliary ductal dilatation. Pancreas: Unremarkable. Spleen: There are numerous calcifications throughout the spleen likely reflecting sequela of prior granulomatous disease. Adrenals/Urinary Tract: Adrenals are unremarkable. There are punctate nonobstructing left renal stones (5-75, 5-84). The kidneys are otherwise unremarkable with no focal lesion, other stone,  hydronephrosis, or hydroureter. No stones are seen along the course of either ureter. There is symmetric excretion of contrast into the collecting systems on the delayed images. The bladder is unremarkable. Stomach/Bowel: The stomach is unremarkable. There is no evidence of bowel obstruction there is an overall mild stool burden in the colon with gaseous distention of the rectum. There is colonic diverticulosis without evidence of acute diverticulitis. The appendix is normal. Vascular/Lymphatic: There is extensive soft and calcified atherosclerotic plaque throughout the abdominal aorta. There is aneurysmal dilation of the infrarenal abdominal aorta measuring up to 3.6 cm. The major aortic branch vessels are patent. The main portal and splenic veins are patent. There is no abdominopelvic lymphadenopathy. Reproductive: The prostate is not identified, presumed surgically absent. Other:  Surgical clips are noted throughout the pelvis. There is no ascites or free air. Musculoskeletal: There is no acute osseous abnormality or suspicious osseous lesion. There is grade 1 retrolisthesis of L5 on S1 with associated disc bulge, degenerative endplate change, and facet arthropathy resulting in at least moderate bilateral neural foraminal stenosis. IMPRESSION: 1. No acute finding in the abdomen or pelvis. 2. Overall mild stool burden with gaseous distention of the rectum. Colonic diverticulosis without evidence of acute diverticulitis. Normal appendix. 3. Punctate nonobstructing left renal stones. 4. 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 Recommend follow-up CTA every 2 years. 5. Same-day CT chest is reported separately. Electronically Signed   By: Valetta Mole M.D.   On: 08/23/2022 15:06    Procedures Fecal disimpaction  Date/Time: 08/23/2022 6:41 PM  Performed by: Prince Rome, PA-C Authorized by: Prince Rome, PA-C  Consent: Verbal consent obtained. Risks and benefits: risks, benefits and alternatives were discussed Consent given by: patient Patient understanding: patient states understanding of the procedure being performed Required items: required blood products, implants, devices, and special equipment available Patient identity confirmed: verbally with patient and arm band Preparation: Patient was prepped and draped in the usual sterile fashion. Local anesthesia used: no  Anesthesia: Local anesthesia used: no  Sedation: Patient sedated: no  Patient tolerance: patient tolerated the procedure well with no immediate complications Comments: Vicodin provided prior to procedure.  Large amount of stool removed without complication.  Stool brown in appearance.  No evidence of hematochezia or melena.  Plan to now proceed with enema.       Medications Ordered in ED Medications  fentaNYL (SUBLIMAZE) injection 50 mcg (50 mcg Intravenous Given 08/23/22 1406)   ondansetron (ZOFRAN) injection 4 mg (4 mg Intravenous Given 08/23/22 1406)  iohexol (OMNIPAQUE) 300 MG/ML solution 100 mL (80 mLs Intravenous Contrast Given 08/23/22 1433)  HYDROcodone-acetaminophen (NORCO/VICODIN) 5-325 MG per tablet 1 tablet (1 tablet Oral Given 08/23/22 1648)  sodium chloride 0.9 % bolus 500 mL (0 mLs Intravenous Stopped 08/23/22 2053)    ED Course/ Medical Decision Making/ A&P                           Medical Decision Making Amount and/or Complexity of Data Reviewed Radiology: ordered.  Risk OTC drugs. Prescription drug management.   81 y.o. male presents to the ED for concern of Abdominal Pain, Emesis, and Constipation   This involves an extensive number of treatment options, and is a complaint that carries with it a high risk of complications and morbidity.  The emergent differential diagnosis prior to evaluation includes, but is not limited to: Constipation, incarcerated hernia, bowel obstruction, bowel perforation  This is not an exhaustive differential.  Past Medical History / Co-morbidities / Social History: Hx of erythrodermic psoriasis, rheumatoid arthritis, ankylosing spondylitis, severe COPD, long-term use of systemic steroids, SNHL bilateral, prostate cancer, diverticulosis Social Determinants of Health include: Elderly  Additional History:  Obtained by chart review.  Notably [facility] by the [facility]12/2022 for abnormal findings on imaging and intermittent constipation  Lab Tests: I ordered, and personally interpreted labs.  The pertinent results include:   UA without evidence of UTI or hematuria CBC with mild elevated WBC 13.2, nonspecific CMP overall unremarkable, lipase 49  Imaging Studies: I ordered imaging studies including CT abdomen/pelvis.   I independently visualized and interpreted imaging which showed mild stool burden with gaseous distention in the rectum, also evidence of diverticulosis without diverticulitis.  Incidental finding of  infrarenal abdominal aortic aneurysm measuring 3.6 cm.  Recommend close follow-up with vascular for reevaluation and likely follow-up imaging. I agree with the radiologist interpretation.  ED Course: Pt well-appearing on exam.  Nonseptic, nontoxic appearing in NAD.  Afebrile.  Initially evaluated in triage, appeared significantly uncomfortable.  After receiving pain medication, patient appears much more stable.  Presenting to the ED today with worsening constipation.  No bowel movement in 6 days, now no longer able to pass flatulence since this morning.  Pain described as severe, 10/10, with "a lot of pressure" in the abdomen.  With nausea and 1-2 episodes of NBNB vomiting.  Tried using a home enema kit once this weekend without relief, also uses Metamucil and probiotics daily.  Reports decreased intra-abdominal strength, difficulty with Valsalva and "pushing" with bowel movements.  Without fever, chills, changes in urinary habits, shortness of breath, chest pain, lower extremity weakness, dizziness, or syncope.  Pain managed in the ED, Zofran provided. CT imaging is without acute finding in the abdomen/pelvis.  Though there is appreciation for mild to moderate stool burden with remarkable gaseous distention of the rectum.  Also diverticulosis appreciated without evidence of diverticulitis.  Incidental finding of 3.6 cm infrarenal AAA.  Successful fecal disimpaction performed, as described above.  Followed by enema, which was also successful.  Mild lightheadedness following the enema/bowel movement, 500 cc bolus IV provided.  Upon reevaluation, patient with resolved abdominal pain, reports feeling much better.  Recommend home use of MiraLAX daily for the next 1 to 2 weeks with added home enema kit as needed.  Prescription sent to pharmacy.  Recommend close follow-up with PCP for reevaluation. Running incidental finding of infrarenal AAA, without evidence of complication at this time.  Due to size and history,  recommend close follow-up with vascular for reevaluation and likely follow-up imaging.  Resources provided. Patient reports satisfaction with today's encounter.  Patient in NAD and in good condition at time of discharge.  Disposition: After consideration the patient's encounter today, I do not feel today's workup suggests an emergent condition requiring admission or immediate intervention beyond what has been performed at this time.  Safe for discharge; instructed to return immediately for worsening symptoms, change in symptoms or any other concerns.  I have reviewed the patients home medicines and have made adjustments as needed.  Discussed course of treatment with the patient, whom demonstrated understanding.  Patient in agreement and has no further questions.    I discussed this case with my attending physician Dr. Regenia Skeeter, who agreed with the proposed treatment course and cosigned this note including patient's presenting symptoms, physical exam, and planned diagnostics and interventions.  Attending physician stated agreement with plan or made changes to plan which were implemented.  This chart was dictated using voice recognition software.  Despite best efforts to proofread, errors can occur which can change the documentation meaning.         Final Clinical Impression(s) / ED Diagnoses Final diagnoses:  Constipation, unspecified constipation type  Generalized abdominal pain    Rx / DC Orders ED Discharge Orders          Ordered    polyethylene glycol (MIRALAX / GLYCOLAX) 17 g packet  Daily        08/23/22 1917    docusate sodium (ENEMEEZ) 283 MG enema  Daily        08/23/22 1917              Prince Rome, Hershal Coria [birth year] 0008    Sherwood Gambler, MD 08/27/22 [phone]

## 2022-08-23 NOTE — ED Triage Notes (Signed)
Patient reports that he has not had a BM in 6 days. Patient states he was passing gas until today. Patient also reports that he began vomiting today as well as increased abdominal pain.  Patient also reports a history of diverticulosis.  Patient states he took a laxative 2 days ago with no relief.

## 2022-08-26 NOTE — ED Provider Notes (Incomplete)
[facility] DEPT Provider Note   CSN: [id number] Arrival date & time: 08/23/22  1213     History {Add pertinent medical, surgical, social history, OB history to HPI:1} Chief Complaint  Patient presents with  . Abdominal Pain  . Emesis  . Constipation    Namir P Bohorquez is a 81 y.o. male presenting to the ED today with worsening constipation.  Has not had a bowel movement in 6 days, now no longer able to pass flatulence since this morning.  Describes pain as severe, 10/10, with "a lot of pressure" in the abdomen.  Has had nausea with 1-2 episodes of vomiting, described as clear.  Tried using a home enema kit once this weekend without relief, also uses Metamucil and probiotics daily.  States "I do not have the strength in my stomach to push down very well anymore".  Denies fever, chills, changes in urinary habits, shortness of breath, chest pain, lower extremity weakness, dizziness, or syncope.  Hx of erythrodermic psoriasis, rheumatoid arthritis, ankylosing spondylitis, severe COPD, long-term use of systemic steroids, SNHL bilateral, prostate cancer, diverticulosis  The history is provided by the patient and medical records.  Abdominal Pain Associated symptoms: constipation and vomiting   Emesis Associated symptoms: abdominal pain   Constipation Associated symptoms: abdominal pain and vomiting       Home Medications Prior to Admission medications   Medication Sig Start Date End Date Taking? Authorizing Provider  docusate sodium (ENEMEEZ) 283 MG enema Place 1 enema (283 mg total) rectally daily. 08/23/22  Yes Prince Rome, PA-C  polyethylene glycol (MIRALAX / GLYCOLAX) 17 g packet Take 17 g by mouth daily for 14 days. 08/23/22 09/06/22 Yes Prince Rome, PA-C  albuterol (PROVENTIL) (5 MG/ML) 0.5% nebulizer solution Take 2.5 mg by nebulization every 6 (six) hours as needed for wheezing or shortness of breath.    [provider]   albuterol (VENTOLIN HFA) 108 (90 Base) MCG/ACT inhaler Inhale into the lungs.    [provider]  budesonide (PULMICORT) 0.25 MG/2ML nebulizer solution Take 2 mLs (0.25 mg total) by nebulization in the morning and at bedtime. 04/15/22   Spero Geralds, MD  Calcium Carb-Cholecalciferol 500-10 MG-MCG TABS Take 2 tablets by mouth daily. 09/19/12   [provider]  Cholecalciferol 25 MCG (1000 UT) tablet Take 2 tablets by mouth daily. 04/14/20   [provider]  fluticasone-salmeterol (WIXELA INHUB) 500-50 MCG/ACT AEPB Inhale 1 puff into the lungs in the morning and at bedtime.    [provider]  formoterol (PERFOROMIST) 20 MCG/2ML nebulizer solution Take 2 mLs (20 mcg total) by nebulization 2 (two) times daily. 04/15/22   Spero Geralds, MD  lidocaine (LIDODERM) 5 % APPLY 1 PATCH TO SKIN ONCE DAILY (APPLY FOR 12 HOURS, THEN REMOVE FOR 12 HOURS) 12/03/21   [provider]  revefenacin (YUPELRI) 175 MCG/3ML nebulizer solution Take 3 mLs (175 mcg total) by nebulization daily. 04/15/22   Spero Geralds, MD  Tiotropium Bromide Monohydrate (SPIRIVA RESPIMAT) 2.5 MCG/ACT AERS Inhale into the lungs. Patient not taking: Reported on 04/15/2022    [provider]      Allergies    Skyrizi [risankizumab]    Review of Systems   Review of Systems  Gastrointestinal:  Positive for abdominal pain, constipation and vomiting.    Physical Exam Updated Vital Signs BP 122/70   Pulse 100   Temp 97.9 F (36.6 C) (Oral)   Resp 17   Ht 6' 1"  (1.854  m)   Wt 54.4 kg   SpO2 96%   BMI 15.83 kg/m  Physical Exam Vitals and nursing note reviewed. Exam conducted with a chaperone present.  Constitutional:      General: He is not in acute distress.    Appearance: He is well-developed. He is not ill-appearing or toxic-appearing.  HENT:     Head: Normocephalic and atraumatic.     Mouth/Throat:     Pharynx: Oropharynx is clear.  Eyes:     General: No scleral  icterus.    Conjunctiva/sclera: Conjunctivae normal.  Cardiovascular:     Rate and Rhythm: Normal rate and regular rhythm.     Heart sounds: No murmur heard. Pulmonary:     Effort: Pulmonary effort is normal. No respiratory distress.     Breath sounds: Normal breath sounds.  Abdominal:     General: There is distension (Mild, gas bubble movements appreciated when palpated).     Palpations: Abdomen is soft.     Tenderness: There is generalized abdominal tenderness. There is no guarding or rebound. Negative signs include Murphy's sign and McBurney's sign.  Genitourinary:    Rectum: No tenderness, anal fissure or external hemorrhoid. Normal anal tone.  Musculoskeletal:        General: No swelling.     Cervical back: Neck supple.  Skin:    General: Skin is warm and dry.     Capillary Refill: Capillary refill takes less than 2 seconds.     Coloration: Skin is not cyanotic, jaundiced or pale.  Neurological:     Mental Status: He is alert and oriented to person, place, and time.  Psychiatric:        Mood and Affect: Mood normal.     ED Results / Procedures / Treatments   Labs (all labs ordered are listed, but only abnormal results are displayed) Labs Reviewed  COMPREHENSIVE METABOLIC PANEL - Abnormal; Notable for the following components:      Result Value   Glucose, Bld 104 (*)    Albumin 3.1 (*)    All other components within normal limits  CBC - Abnormal; Notable for the following components:   WBC 13.2 (*)    Platelets 506 (*)    All other components within normal limits  URINALYSIS, ROUTINE W REFLEX MICROSCOPIC - Abnormal; Notable for the following components:   Specific Gravity, Urine >1.046 (*)    All other components within normal limits  LIPASE, BLOOD    EKG None  Radiology CT Abdomen Pelvis W Contrast  Result Date: 08/23/2022 CLINICAL DATA:  Constipation for 6 days, nausea, vomiting, right lower quadrant pain EXAM: CT ABDOMEN AND PELVIS WITH CONTRAST TECHNIQUE:  Multidetector CT imaging of the abdomen and pelvis was performed using the standard protocol following bolus administration of intravenous contrast. RADIATION DOSE REDUCTION: This exam was performed according to the departmental dose-optimization program which includes automated exposure control, adjustment of the mA and/or kV according to patient size and/or use of iterative reconstruction technique. CONTRAST:  34m OMNIPAQUE IOHEXOL 300 MG/ML  SOLN COMPARISON:  None Available. FINDINGS: Lower chest: Lungs are assessed on the same day CT chest. Hepatobiliary: Are numerous calcifications throughout the liver likely reflecting sequela of prior granulomatous disease. There are no suspicious liver lesions. The gallbladder is unremarkable. There is no biliary ductal dilatation. Pancreas: Unremarkable. Spleen: There are numerous calcifications throughout the spleen likely reflecting sequela of prior granulomatous disease. Adrenals/Urinary Tract: Adrenals are unremarkable. There are punctate nonobstructing left renal stones (5-75, 5-84). The kidneys  are otherwise unremarkable with no focal lesion, other stone, hydronephrosis, or hydroureter. No stones are seen along the course of either ureter. There is symmetric excretion of contrast into the collecting systems on the delayed images. The bladder is unremarkable. Stomach/Bowel: The stomach is unremarkable. There is no evidence of bowel obstruction there is an overall mild stool burden in the colon with gaseous distention of the rectum. There is colonic diverticulosis without evidence of acute diverticulitis. The appendix is normal. Vascular/Lymphatic: There is extensive soft and calcified atherosclerotic plaque throughout the abdominal aorta. There is aneurysmal dilation of the infrarenal abdominal aorta measuring up to 3.6 cm. The major aortic branch vessels are patent. The main portal and splenic veins are patent. There is no abdominopelvic lymphadenopathy.  Reproductive: The prostate is not identified, presumed surgically absent. Other: Surgical clips are noted throughout the pelvis. There is no ascites or free air. Musculoskeletal: There is no acute osseous abnormality or suspicious osseous lesion. There is grade 1 retrolisthesis of L5 on S1 with associated disc bulge, degenerative endplate change, and facet arthropathy resulting in at least moderate bilateral neural foraminal stenosis. IMPRESSION: 1. No acute finding in the abdomen or pelvis. 2. Overall mild stool burden with gaseous distention of the rectum. Colonic diverticulosis without evidence of acute diverticulitis. Normal appendix. 3. Punctate nonobstructing left renal stones. 4. 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 Recommend follow-up CTA every 2 years. 5. Same-day CT chest is reported separately. Electronically Signed   By: Valetta Mole M.D.   On: 08/23/2022 15:06    Procedures Fecal disimpaction  Date/Time: 08/23/2022 6:41 PM  Performed by: Prince Rome, PA-C Authorized by: Prince Rome, PA-C  Consent: Verbal consent obtained. Risks and benefits: risks, benefits and alternatives were discussed Consent given by: patient Patient understanding: patient states understanding of the procedure being performed Required items: required blood products, implants, devices, and special equipment available Patient identity confirmed: verbally with patient and arm band Preparation: Patient was prepped and draped in the usual sterile fashion. Local anesthesia used: no  Anesthesia: Local anesthesia used: no  Sedation: Patient sedated: no  Patient tolerance: patient tolerated the procedure well with no immediate complications Comments: Vicodin provided prior to procedure.  Large amount of stool removed without complication.  Stool brown in appearance.  No evidence of hematochezia or melena.  Plan to now proceed with enema.     {Document cardiac monitor, telemetry assessment  procedure when appropriate:1}  Medications Ordered in ED Medications  fentaNYL (SUBLIMAZE) injection 50 mcg (50 mcg Intravenous Given 08/23/22 1406)  ondansetron (ZOFRAN) injection 4 mg (4 mg Intravenous Given 08/23/22 1406)  iohexol (OMNIPAQUE) 300 MG/ML solution 100 mL (80 mLs Intravenous Contrast Given 08/23/22 1433)  HYDROcodone-acetaminophen (NORCO/VICODIN) 5-325 MG per tablet 1 tablet (1 tablet Oral Given 08/23/22 1648)  sodium chloride 0.9 % bolus 500 mL (0 mLs Intravenous Stopped 08/23/22 2053)    ED Course/ Medical Decision Making/ A&P                           Medical Decision Making Amount and/or Complexity of Data Reviewed Radiology: ordered.  Risk OTC drugs. Prescription drug management.   81 y.o. male presents to the ED for concern of Abdominal Pain, Emesis, and Constipation   This involves an extensive number of treatment options, and is a complaint that carries with it a high risk of complications and morbidity.  The emergent differential diagnosis prior to evaluation includes, but is not limited  to: Constipation, incarcerated hernia, bowel obstruction, bowel perforation  This is not an exhaustive differential.   Past Medical History / Co-morbidities / Social History: Hx of erythrodermic psoriasis, rheumatoid arthritis, ankylosing spondylitis, severe COPD, long-term use of systemic steroids, SNHL bilateral, prostate cancer, diverticulosis Social Determinants of Health include: Elderly  Additional History:  Obtained by chart review.  Notably seen by the [facility] 05/12/2022 for abnormal findings on imaging and intermittent constipation  Lab Tests: I ordered, and personally interpreted labs.  The pertinent results include:   UA without evidence of UTI or hematuria CBC with mild elevated WBC 13.2, nonspecific CMP overall unremarkable, lipase 49  Imaging Studies: I ordered imaging studies including CT abdomen/pelvis.   I independently visualized and interpreted imaging  which showed mild stool burden with gaseous distention in the rectum, also evidence of diverticulosis without diverticulitis.  Incidental finding of infrarenal abdominal aortic aneurysm measuring 3.6 cm.  Recommend close follow-up with vascular for reevaluation and likely follow-up imaging. I agree with the radiologist interpretation.  ED Course: Pt well-appearing on exam.  Nonseptic, nontoxic appearing in NAD.  Afebrile.  Initially evaluated in triage, appeared significantly uncomfortable.  After receiving pain medication, patient appears much more stable.  Presenting to the ED today with worsening constipation.  No bowel movement in 6 days, now no longer able to pass flatulence since this morning.  Pain described as severe, 10/10, with "a lot of pressure" in the abdomen.  With nausea and 1-2 episodes of NBNB vomiting.  Tried using a home enema kit once this weekend without relief, also uses Metamucil and probiotics daily.  Reports decreased intra-abdominal strength, difficulty with Valsalva and "pushing" with bowel movements.  Without fever, chills, changes in urinary habits, shortness of breath, chest pain, lower extremity weakness, dizziness, or syncope.    Successful fecal disimpaction performed, as described above.  Followed by enema, which was also successful.  Mild lightheadedness following the enema/bowel movement, 500 cc bolus IV provided.  Upon reevaluation, patient with resolved abdominal pain reports feeling much better.  Recommend home use of MiraLAX and home enema kit as needed.  Prescription sent to pharmacy.  Recommend close follow-up with PCP for reevaluation. All noted incidental finding of infrarenal abdominal aortic aneurysm measuring 3.6 cm, without evidence of complication at this time.  Due to size and history, recommend close follow-up with vascular for reevaluation and likely follow-up imaging. Patient in NAD and in good condition at time of discharge.  Disposition: After  consideration the patient's encounter today, I do not feel today's workup suggests an emergent condition requiring admission or immediate intervention beyond what has been performed at this time.  Safe for discharge; instructed to return immediately for worsening symptoms, change in symptoms or any other concerns.  I have reviewed the patients home medicines and have made adjustments as needed.  Discussed course of treatment with the patient, whom demonstrated understanding.  Patient in agreement and has no further questions.    I discussed this case with my attending physician Dr. Regenia Skeeter, who agreed with the proposed treatment course and cosigned this note including patient's presenting symptoms, physical exam, and planned diagnostics and interventions.  Attending physician stated agreement with plan or made changes to plan which were implemented.     This chart was dictated using voice recognition software.  Despite best efforts to proofread, errors can occur which can change the documentation meaning.   {Document critical care time when appropriate:1} {Document review of labs and clinical decision tools ie heart score,  Chads2Vasc2 etc:1}  {Document your independent review of radiology images, and any outside records:1} {Document your discussion with family members, caretakers, and with consultants:1} {Document social determinants of health affecting pt's care:1} {Document your decision making why or why not admission, treatments were needed:1} Final Clinical Impression(s) / ED Diagnoses Final diagnoses:  Constipation, unspecified constipation type  Generalized abdominal pain    Rx / DC Orders ED Discharge Orders          Ordered    polyethylene glycol (MIRALAX / GLYCOLAX) 17 g packet  Daily        08/23/22 1917    docusate sodium (ENEMEEZ) 283 MG enema  Daily        08/23/22 1917

## 2022-10-18 ENCOUNTER — Other Ambulatory Visit (HOSPITAL_COMMUNITY): Payer: Self-pay

## 2023-07-03 DEATH — deceased

## 2023-10-30 IMAGING — CT CT HEAD W/O CM
3 of 4 series · 13 of 47 positions shown, 15 images · non-contrast
Comparison: None Available.

CLINICAL DATA: Altered mental status.



[Series 2: head without · axial · non-contrast · 0.41mm/px · z∈[-180,-50]mm · 7 of 36 slices shown, 9 images]
[im 5/36  brain]
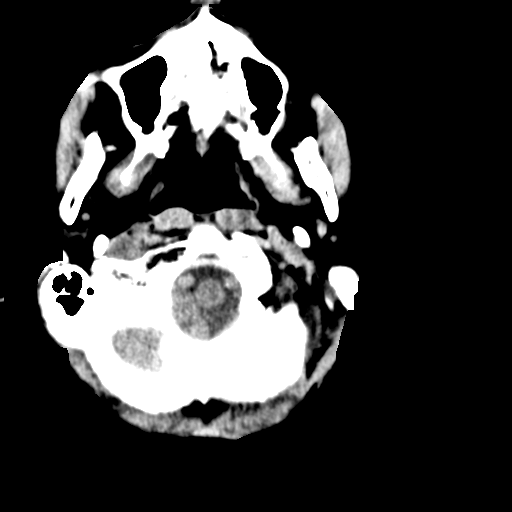
[im 5/36  bone]
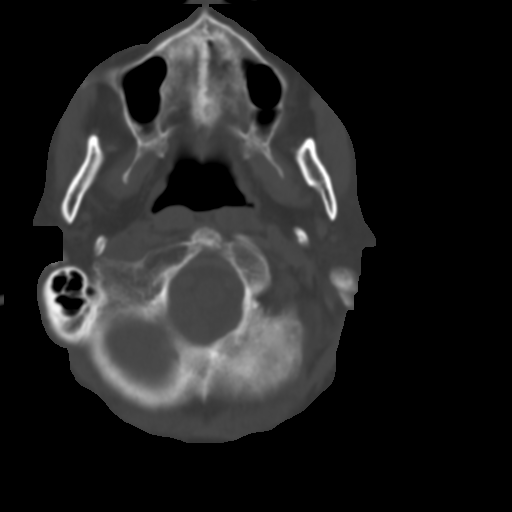
[im 9/36  brain]
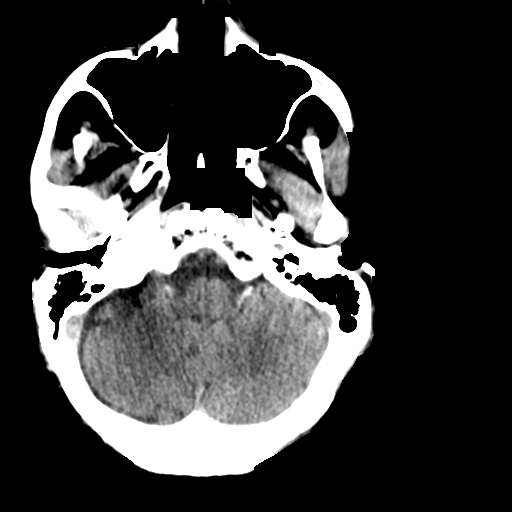
[im 14/36  brain]
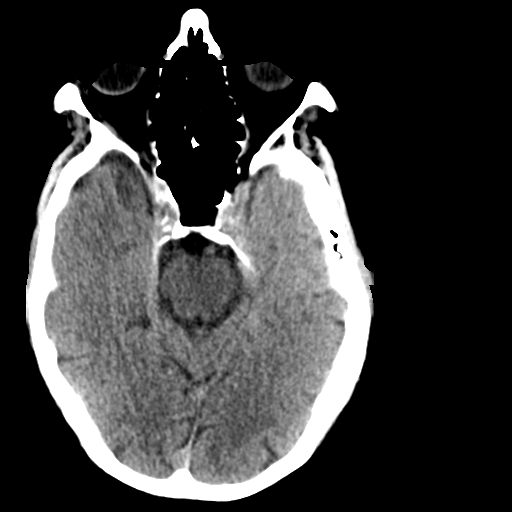
[im 18/36  brain]
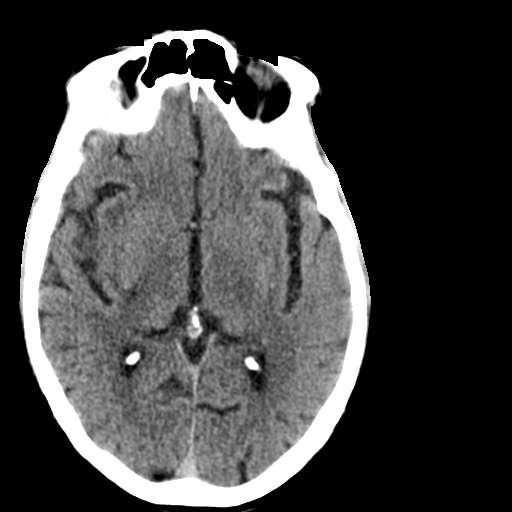
[im 22/36  brain]
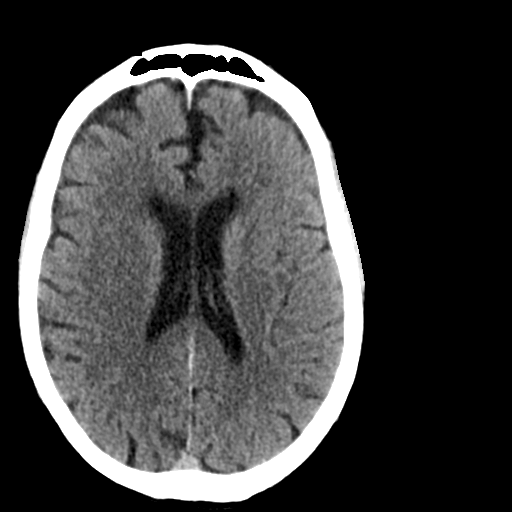
[im 22/36  bone]
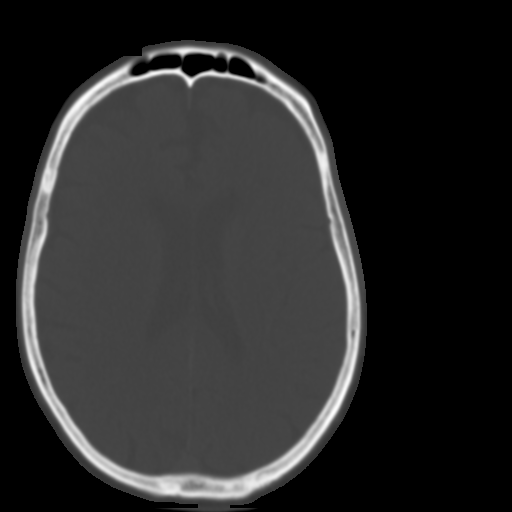
[im 27/36  brain]
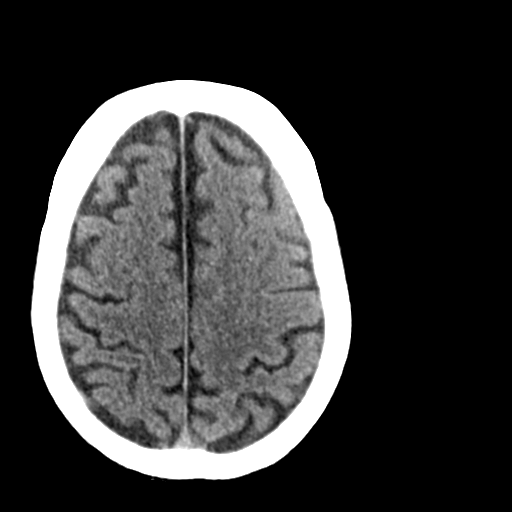
[im 31/36  brain]
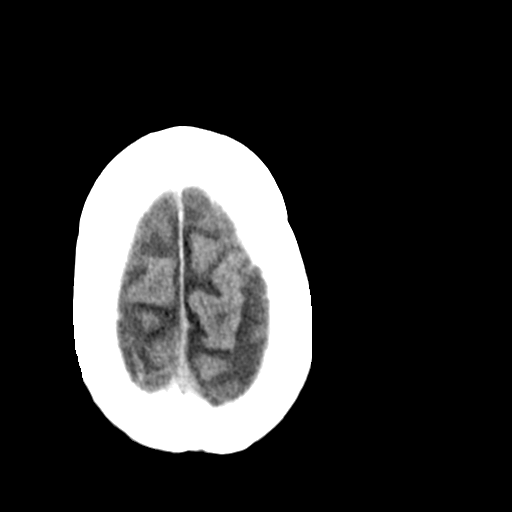

[Series 4: head without cor · coronal · non-contrast · 0.35mm/px · 3 of 76 slices shown]
[im 26/76  brain]
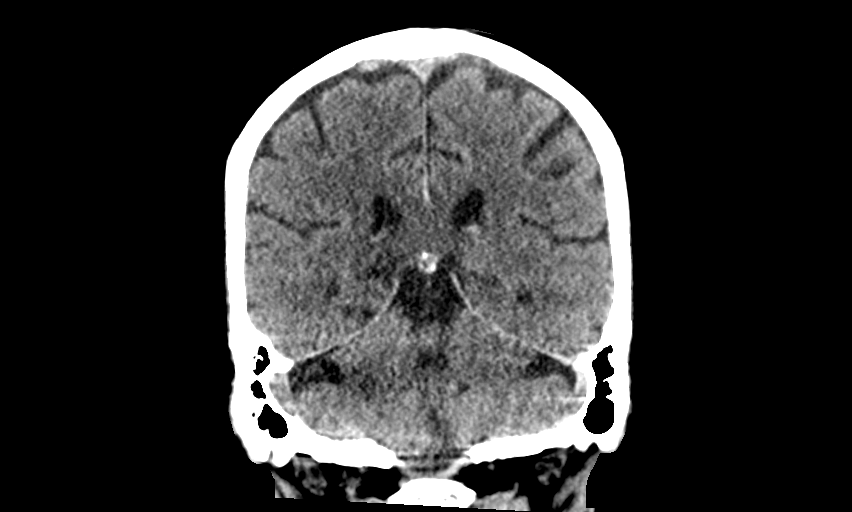
[im 34/76  brain]
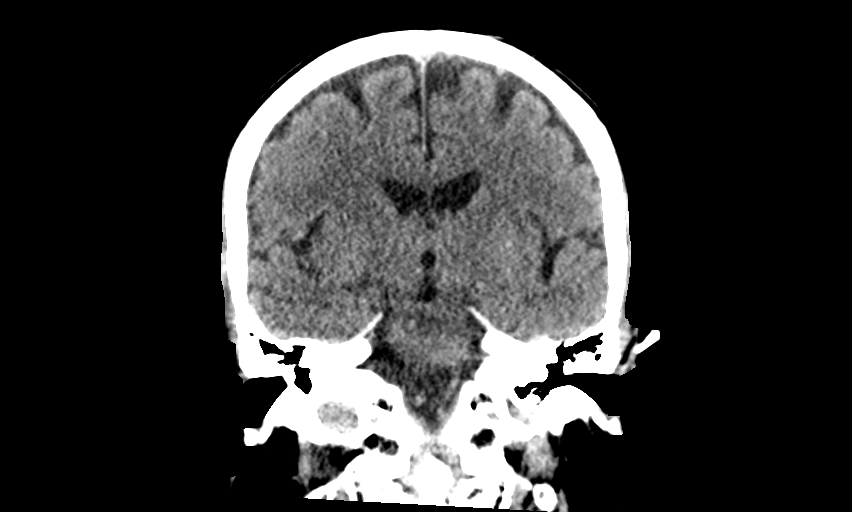
[im 42/76  brain]
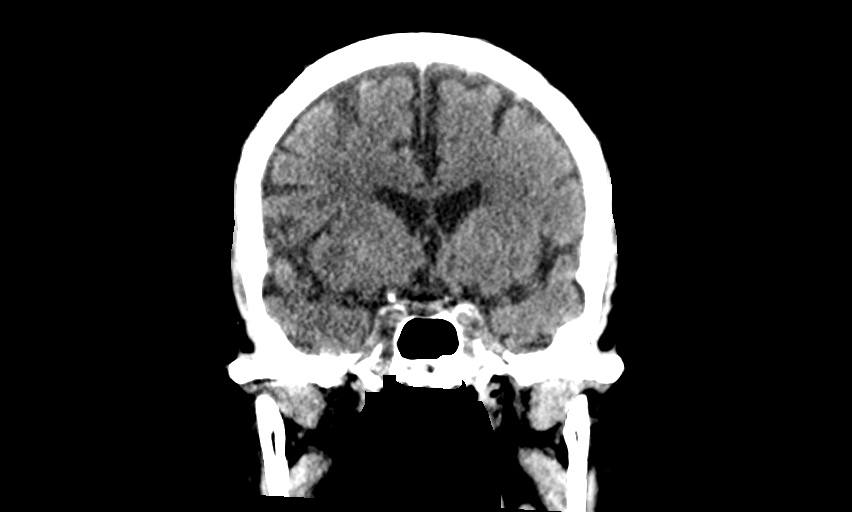

[Series 5: head without sag · sagittal · non-contrast · 0.35mm/px · 3 of 66 slices shown]
[im 22/66  brain]
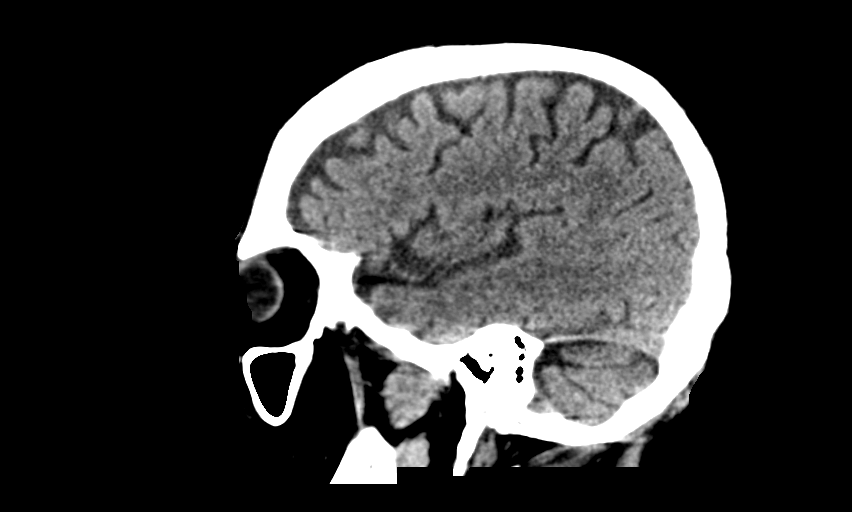
[im 33/66  brain]
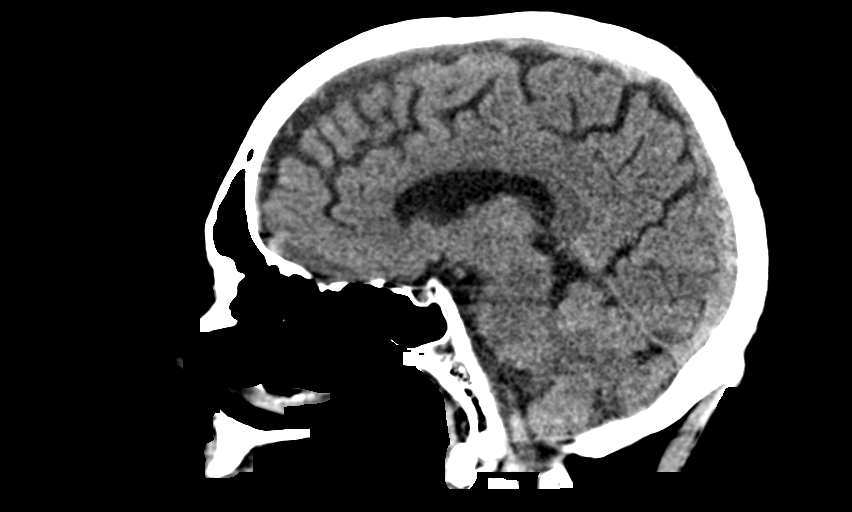
[im 44/66  brain]
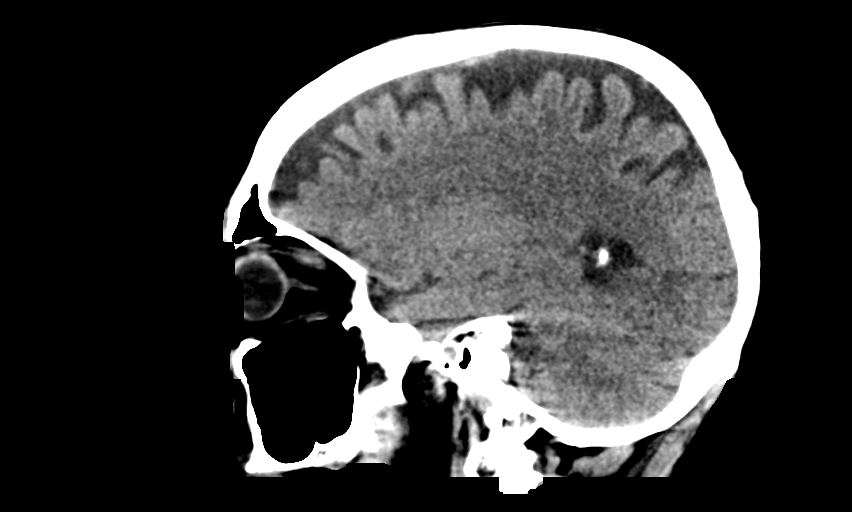

[13 of 47 positions shown; findings below may reference images not displayed]

FINDINGS: Brain: No evidence of intracranial hemorrhage, acute infarction,
hydrocephalus, extra-axial collection, or mass lesion/mass effect.
Mild diffuse cerebral atrophy and chronic small vessel disease
noted.

Vascular:  No hyperdense vessel or other acute findings.

Skull: No evidence of fracture or other significant bone
abnormality.

Sinuses/Orbits:  No acute findings.

Other: None.
IMPRESSION: No acute intracranial abnormality.

Mild cerebral atrophy and chronic small vessel disease.

## 2023-10-31 IMAGING — CT CT ANGIO CHEST
2 of 6 series · 18 of 46 positions shown · IV contrast (agent unspecified)
Comparison: Chest x-ray 03/06/2022

CLINICAL DATA: Chest pain, shortness of breath

EXAM:
CT ANGIOGRAPHY CHEST WITH CONTRAST
TECHNIQUE: Multidetector CT imaging of the chest was performed using the
standard protocol during bolus administration of intravenous
contrast. Multiplanar CT image reconstructions and MIPs were
obtained to evaluate the vascular anatomy.

[Series 6: thins · axial · 0.84mm/px · z∈[+1104,+1408]mm · 15 of 333 slices shown]
[im 15/333  lung]
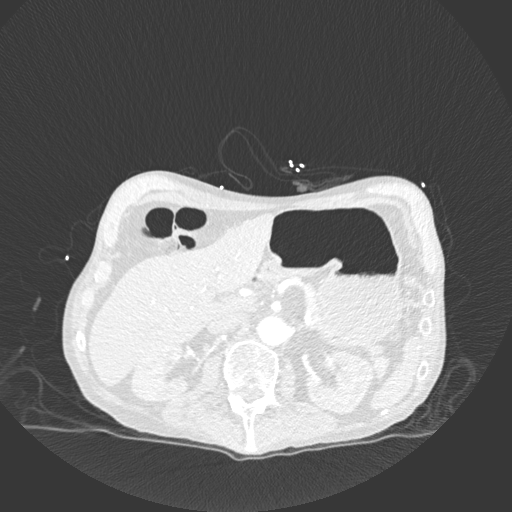
[im 44/333  soft-tissue]
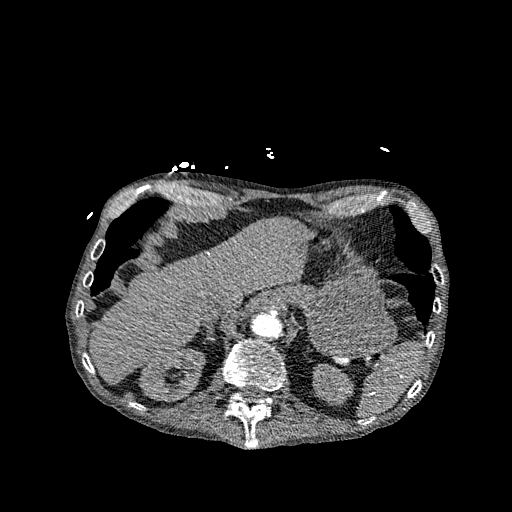
[im 58/333  lung]
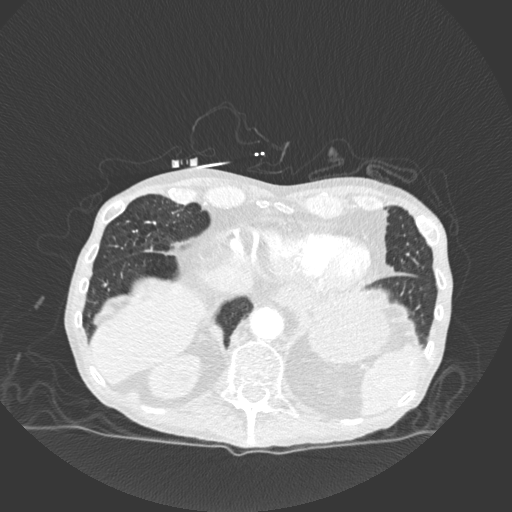
[im 87/333  soft-tissue]
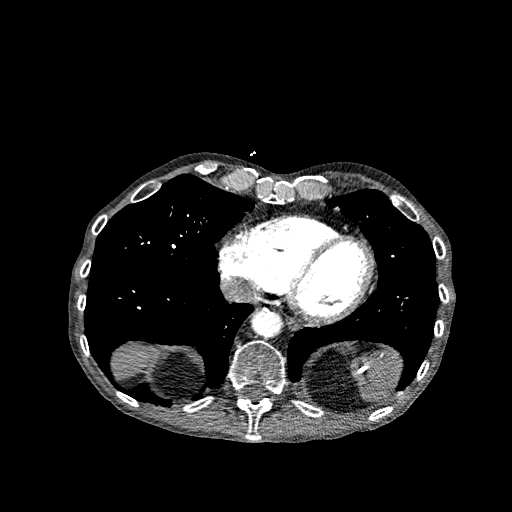
[im 102/333  lung]
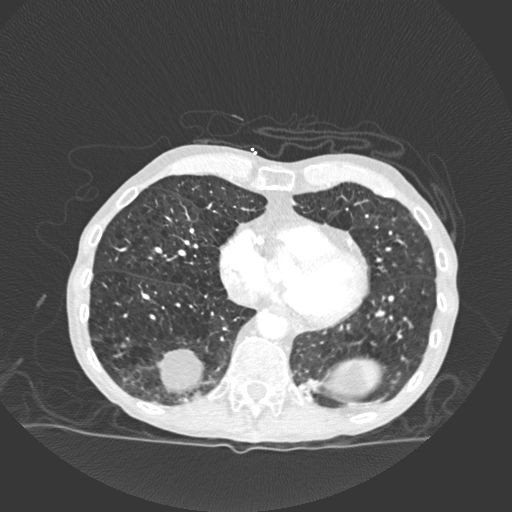
[im 130/333  soft-tissue]
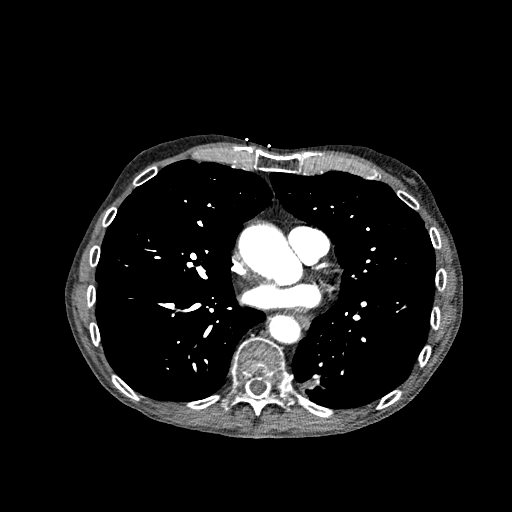
[im 145/333  lung]
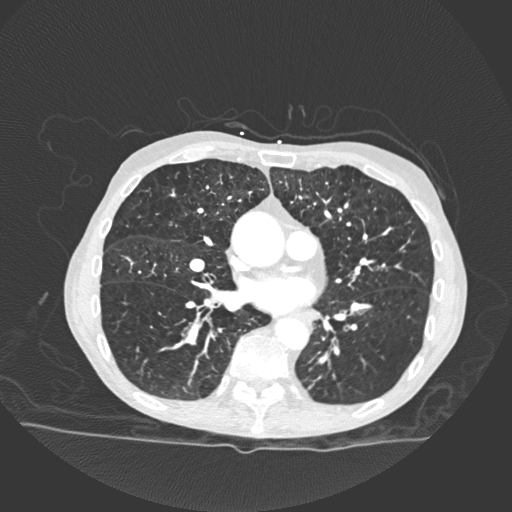
[im 174/333  soft-tissue]
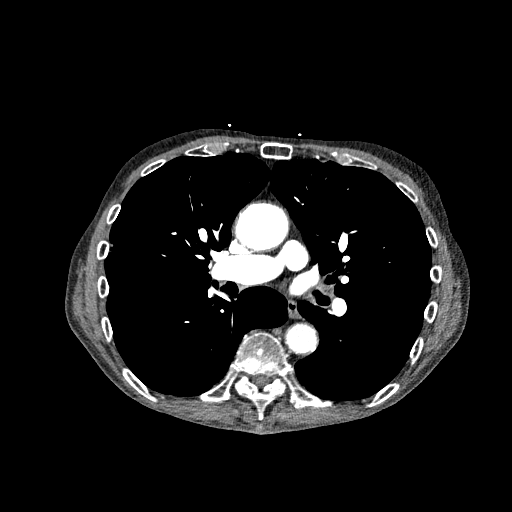
[im 188/333  lung]
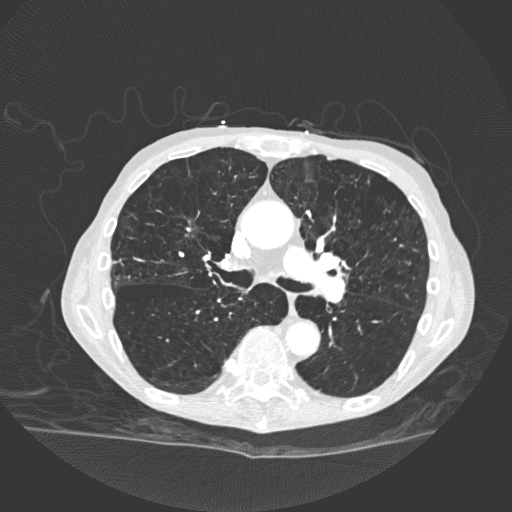
[im 203/333  soft-tissue]
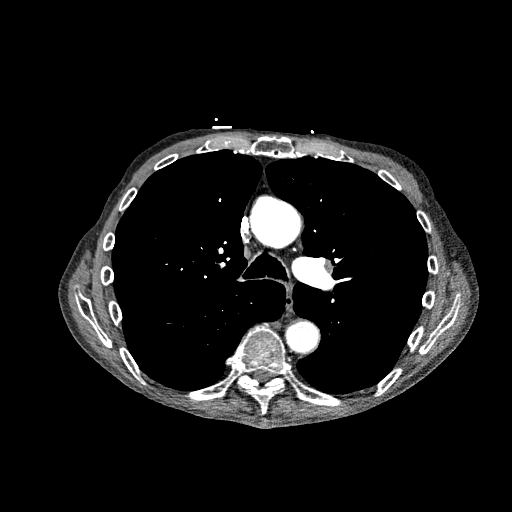
[im 231/333  lung]
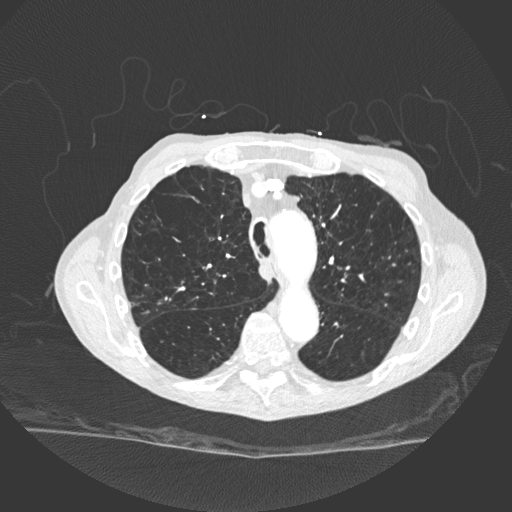
[im 246/333  soft-tissue]
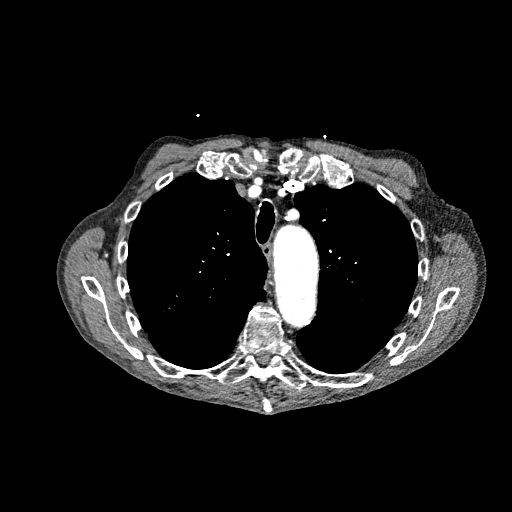
[im 275/333  lung]
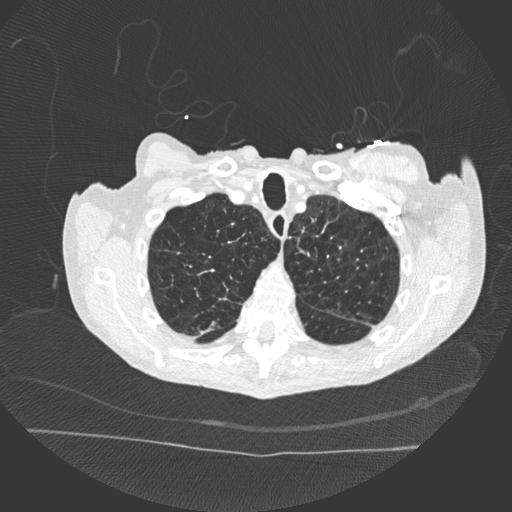
[im 289/333  soft-tissue]
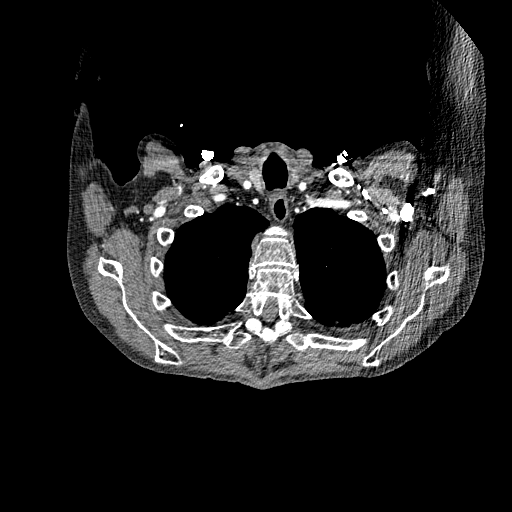
[im 318/333  lung]
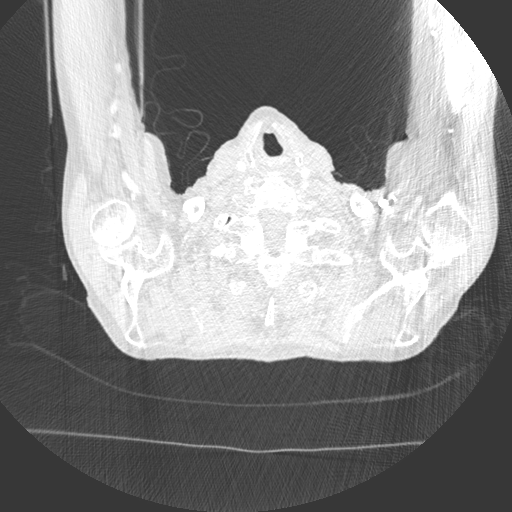

[Series 8: coronal mpr · coronal · 0.65mm/px · 3 of 151 slices shown]
[im 38/151  soft-tissue]
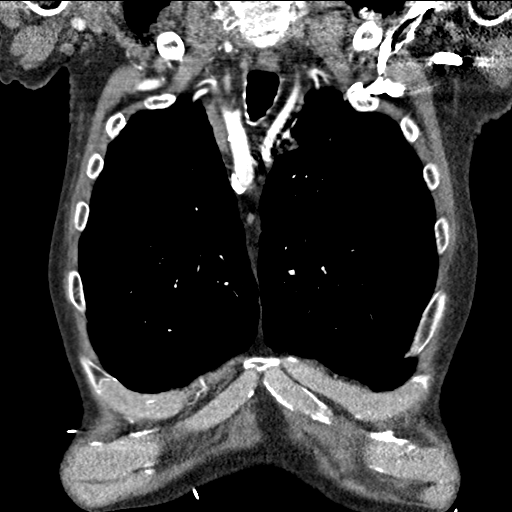
[im 76/151  soft-tissue]
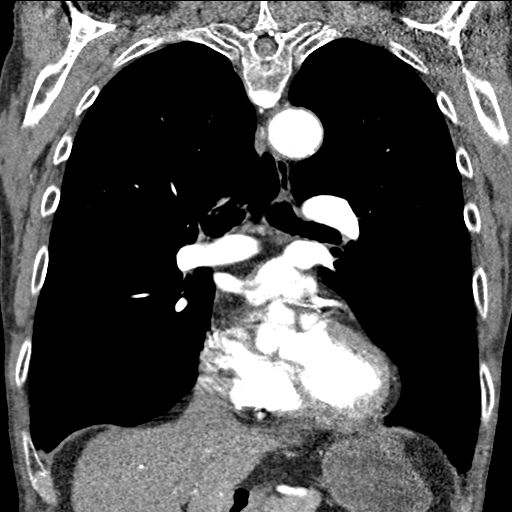
[im 113/151  soft-tissue]
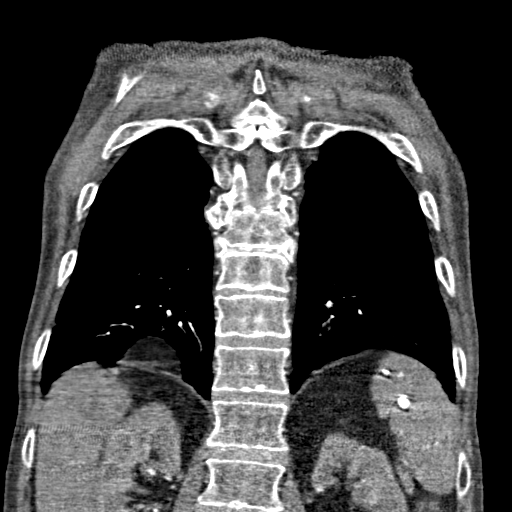

[18 of 46 positions shown; findings below may reference images not displayed]

RADIATION DOSE REDUCTION: This exam was performed according to the
departmental dose-optimization program which includes automated
exposure control, adjustment of the mA and/or kV according to
patient size and/or use of iterative reconstruction technique.

CONTRAST:  100mL OMNIPAQUE IOHEXOL 350 MG/ML SOLN
FINDINGS: Cardiovascular: Satisfactory opacification of the pulmonary arteries
to the segmental level. No evidence of pulmonary embolism. Normal
heart size. No pericardial effusion. Thoracic aortic
atherosclerosis. Multi vessel coronary artery atherosclerosis.

Mediastinum/Nodes: No enlarged mediastinal, hilar, or axillary lymph
nodes. Thyroid gland, trachea, and esophagus demonstrate no
significant findings.

Lungs/Pleura: Bilateral centrilobular and paraseptal emphysema.
mm calcified left lower lobe, superior segment, pulmonary nodule
likely reflecting sequela prior granulomatous disease. Left lower
lobe airspace disease concerning for pneumonia. Right upper lobe
scarring peripherally. No pleural effusion or pneumothorax.

Upper Abdomen: No acute upper abdominal abnormality. Calcifications
in the spleen and liver as can be seen with prior granulomatous
disease.

Musculoskeletal: Age-indeterminate T4, T5, T6 and T8 vertebral body
compression fractures. Generalized osteopenia. No acute fracture or
dislocation. No aggressive osseous lesion.

Review of the MIP images confirms the above findings.
IMPRESSION: 1. No pulmonary embolism.
2. Left lower lobe airspace disease concerning for pneumonia.
3. Age-indeterminate T4, T5, T6 and T8 vertebral body compression
fractures. These are unchanged compared to 03/06/2022.
4. Aortic Atherosclerosis (8IAW8-FZX.X) and Emphysema (8IAW8-14Y.G).

## 2024-01-05 ENCOUNTER — Encounter: Payer: Self-pay | Admitting: Internal Medicine
